# Patient Record
Sex: Male | Born: 1960 | Race: White | Hispanic: No | Marital: Married | Smoking: Former smoker
Health system: Southern US, Community
[De-identification: ages and names within clinical notes are randomized; demographics above are authoritative.]

## PROBLEM LIST (undated history)

## (undated) DIAGNOSIS — K219 Gastro-esophageal reflux disease without esophagitis: Secondary | ICD-10-CM

## (undated) DIAGNOSIS — C61 Malignant neoplasm of prostate: Secondary | ICD-10-CM

## (undated) DIAGNOSIS — J449 Chronic obstructive pulmonary disease, unspecified: Secondary | ICD-10-CM

## (undated) DIAGNOSIS — N401 Enlarged prostate with lower urinary tract symptoms: Secondary | ICD-10-CM

## (undated) DIAGNOSIS — Z972 Presence of dental prosthetic device (complete) (partial): Secondary | ICD-10-CM

## (undated) DIAGNOSIS — Z973 Presence of spectacles and contact lenses: Secondary | ICD-10-CM

## (undated) DIAGNOSIS — R3912 Poor urinary stream: Secondary | ICD-10-CM

## (undated) HISTORY — PX: UMBILICAL HERNIA REPAIR: SHX196

## (undated) HISTORY — PX: PROSTATE BIOPSY: SHX241

## (undated) HISTORY — PX: INGUINAL HERNIA REPAIR: SUR1180

## (undated) HISTORY — PX: TONSILLECTOMY: SUR1361

---

## 2000-12-29 ENCOUNTER — Ambulatory Visit (HOSPITAL_BASED_OUTPATIENT_CLINIC_OR_DEPARTMENT_OTHER): Admission: RE | Admit: 2000-12-29 | Discharge: 2000-12-29 | Payer: Self-pay | Admitting: *Deleted

## 2000-12-29 ENCOUNTER — Encounter (INDEPENDENT_AMBULATORY_CARE_PROVIDER_SITE_OTHER): Payer: Self-pay | Admitting: Specialist

## 2006-12-19 ENCOUNTER — Ambulatory Visit: Admission: RE | Admit: 2006-12-19 | Discharge: 2006-12-19 | Payer: Self-pay | Admitting: Family Medicine

## 2007-01-05 ENCOUNTER — Ambulatory Visit (HOSPITAL_BASED_OUTPATIENT_CLINIC_OR_DEPARTMENT_OTHER): Admission: RE | Admit: 2007-01-05 | Discharge: 2007-01-05 | Payer: Self-pay | Admitting: Surgery

## 2010-07-24 NOTE — Op Note (Signed)
[name], [name]             ACCOUNT NO.:  [id number]   MEDICAL RECORD NO.:  [id number]          PATIENT TYPE:  AMB   LOCATION:  [facility]                         FACILITY:  [facility]   PHYSICIAN:  Wilmon Arms. Corliss Skains, M.D. DATE OF BIRTH:  [birth year]   DATE OF PROCEDURE:  01/05/2007  DATE OF DISCHARGE:                               OPERATIVE REPORT   PREOPERATIVE DIAGNOSIS:  Umbilical hernia.   POSTOPERATIVE DIAGNOSIS:  Umbilical hernia.   PROCEDURE PERFORMED:  Umbilical hernia repair with mesh.   SURGEON:  Wilmon Arms. Corliss Skains, M.D., FACS   ANESTHESIA:  General.   INDICATIONS:  The patient is a 50 year old male who presents with a 2-  month history of a bulge in his umbilicus.  He has had some discomfort  but no obstructive symptoms.  He was noted to have an umbilical hernia.  We recommended surgical repair.   DESCRIPTION OF PROCEDURE:  The patient brought to the operating room and  placed in supine position on the operating table.  After an adequate  level of general anesthesia was obtained, the patient's periumbilical  region was shaved, prepped with Betadine and draped in sterile fashion.  Time-out was taken assure proper patient, proper procedure.  The area  below his umbilicus was infiltrated with 0.25% Marcaine.  I  made a  transverse incision below his umbilicus.  Dissection was carried down  into the subcutaneous tissues with cautery.  We bluntly dissected down  to the fascia.  I then worked superiorly and peeled the umbilical hernia  sac off of the posterior surface of the umbilical dermis.  The hernia  sac was opened and contained only some omental fat.  This was all  reduced back in the preperitoneal space.  The defect was only about 3 mm  across.  I opened it up to about a centimeter and opened up the  preperitoneal space bluntly.  A small Ventralex mesh was then inserted  in the preperitoneal space.  This secured with four interrupted 0  Novofil sutures.  0 Novofil was  used to close the fascia anterior to the  mesh.  3-0 Vicryl was used to close the dermis and 4-0 Monocryl was used  to close the skin.  Steri-Strips and clean dressings were applied.  The  patient was extubated and brought to recovery in stable condition.  All  sponge, instrument, needle counts correct.      Wilmon Arms. Tsuei, M.D.  Electronically Signed     MKT/MEDQ  D:  01/05/2007  T:  01/06/2007  Job:  [id number]

## 2010-07-27 NOTE — Op Note (Signed)
Wyatt [name], JAY             ACCOUNT NO.:  [id number]   MEDICAL RECORD NO.:  [id number]          PATIENT TYPE:  AMB   LOCATION:  [facility]                         FACILITY:  [facility]   PHYSICIAN:  Wilmon Arms. Corliss Skains, M.D. DATE OF BIRTH:  18-Apr-1960   DATE OF PROCEDURE:  DATE OF DISCHARGE:                               OPERATIVE REPORT      Wilmon Arms. Tsuei, M.D.  Electronically Signed     MKT/MEDQ  D:  01/06/2007  T:  01/06/2007  Job:

## 2010-07-27 NOTE — Op Note (Signed)
[place]  Patient:    Wyatt [name], Wyatt [name] Visit Number: [id number] MRN: [id number]          Service Type: DSU Location: [facility] Attending Physician:  Kandis Mannan Dictated by:   Donnie Coffin Samuella Cota, M.D. Proc. Date: 12/29/00 Admit Date:  12/29/2000   CC:         Dr. Renae Fickle   Operative Report  CCS# [phone]  PREOPERATIVE DIAGNOSIS:  Right inguinal hernia.  POSTOPERATIVE DIAGNOSIS:  Right inguinal hernia.  OPERATION PERFORMED:  Right inguinal herniorrhaphy with mesh.  SURGEON:  Maisie Fus B. Samuella Cota, M.D.  ANESTHESIA:  Local 1% Xylocaine without epinephrine, 0.25% Marcaine without epinephrine and sodium bicarbonate with anesthesia monitoring, anesthesiologist and CRNA  DESCRIPTION OF PROCEDURE:  The patient was taken to the operating room and placed on the table in supine position.  The right lower quadrant of the abdomen was prepped and draped as a sterile field.  An oblique skin incision was outlined with the skin marker.  The local anesthetic was used to block the ilioinguinal nerve the area for the incision, then used throughout as necessary.  The oblique incision was made through skin and subcutaneous tissues with subcutaneous bleeders being cauterized with the Bovie. The external oblique aponeurosis was divided in the line of its fibers to and through the external ring.  The cord structures and the ilioinguinal nerve were isolated with Penrose drain.  Dissection at the anterior medial aspect of the cord structures near the internal ring revealed that the patient had an indirect hernia sac which extended down about 5 to 6 cm.  This had a rather wide neck.  The hernia sac was opened.  There was no content in the hernia sac.  The hernia sac was twisted and a high suture ligation carried out with a suture ligature of 0 chromic catgut.  The stump was allowed to retract beneath the transversus muscle.  The patient had some minimal weakness of  the inguinal floor but did not really have a direct hernia.  A piece of 3 inch x 6 inch Atrium mesh was then fashioned to cover the entire inguinal floor to extend around the cord structures at the internal ring.  The mesh was anchored inferiorly with a running suture of 2-0 Novofil with the first suture being placed in the pubic tubercle, the other sutures in the shelving edge of Pouparts ligament.  The mesh was anchored superiorly and medially with interrupted sutures of 0 Novofil. A slit was made in the mesh to accommodate the cord structures of the internal ring and the mesh was sutured to itself superior and lateral to the internal ring with a 3-0 Vicryl suture.  The mesh seemed to be lying nicely in the inguinal floor and there was sufficient space for the cord structures to come through the internal ring.  The cord structures and ilioinguinal nerve were then returned to their normal anatomic position.  The inguinal nerve was then injected with the local to get a good block.  The external oblique aponeurosis was reapproximated with interrupted sutures of 3-0 Vicryl recreating the external ring so the tip of the little finger could be inserted through the external ring.  The subcutaneous tissues was irrigated, Scarpas fascia was closed with 3-0 Vicryl suture and the skin was closed with running subcuticular suture of 4-0 Vicryl.  Benzoin and half-inch Steri-Strips were used to reinforce the skin closure.  Dry sterile dressing was applied.  The patient seemed to tolerate the  procedure well and was taken to the PACU in satisfactory condition. Dictated by:   Donnie Coffin Samuella Cota, M.D. Attending Physician:  Kandis Mannan DD:  12/29/00 TD:  12/29/00 Job: [id number]

## 2010-12-19 LAB — BASIC METABOLIC PANEL
CO2: 31
Calcium: 9.1
Creatinine, Ser: 0.94
GFR calc Af Amer: >60
GFR calc non Af Amer: >60
Sodium: 142

## 2010-12-19 LAB — DIFFERENTIAL
Basophils Relative: 0
Lymphocytes Relative: 28
Monocytes Absolute: 0.5
Monocytes Relative: 7
Neutro Abs: 5
Neutrophils Relative %: 64

## 2010-12-19 LAB — CBC
Hemoglobin: 17.5 — ABNORMAL HIGH
MCHC: 35.3
RBC: 5.55
WBC: 7.8

## 2011-07-22 ENCOUNTER — Ambulatory Visit
Admission: RE | Admit: 2011-07-22 | Discharge: 2011-07-22 | Disposition: A | Discharge status: Home / Self Care | Payer: Managed Care, Other (non HMO) | Source: Ambulatory Visit | Attending: Gastroenterology | Admitting: Gastroenterology

## 2011-07-22 ENCOUNTER — Other Ambulatory Visit: Payer: Self-pay | Admitting: Gastroenterology

## 2017-07-31 ENCOUNTER — Ambulatory Visit
Admission: RE | Admit: 2017-07-31 | Discharge: 2017-07-31 | Disposition: A | Discharge status: Home / Self Care | Payer: Managed Care, Other (non HMO) | Source: Ambulatory Visit | Attending: Family Medicine | Admitting: Family Medicine

## 2017-07-31 ENCOUNTER — Other Ambulatory Visit: Payer: Self-pay | Admitting: Family Medicine

## 2017-07-31 DIAGNOSIS — R0602 Shortness of breath: Secondary | ICD-10-CM

## 2017-08-18 ENCOUNTER — Other Ambulatory Visit: Payer: Self-pay | Admitting: Urology

## 2017-08-18 DIAGNOSIS — C61 Malignant neoplasm of prostate: Secondary | ICD-10-CM

## 2017-08-26 ENCOUNTER — Ambulatory Visit (HOSPITAL_COMMUNITY)
Admission: RE | Admit: 2017-08-26 | Discharge: 2017-08-26 | Disposition: A | Discharge status: Home / Self Care | Payer: Managed Care, Other (non HMO) | Source: Ambulatory Visit | Attending: Urology | Admitting: Urology

## 2017-08-26 ENCOUNTER — Encounter (HOSPITAL_COMMUNITY)
Admission: RE | Admit: 2017-08-26 | Discharge: 2017-08-26 | Disposition: A | Discharge status: Home / Self Care | Payer: Managed Care, Other (non HMO) | Source: Ambulatory Visit | Attending: Urology | Admitting: Urology

## 2017-08-26 DIAGNOSIS — C61 Malignant neoplasm of prostate: Secondary | ICD-10-CM | POA: Insufficient documentation

## 2017-08-26 DIAGNOSIS — R93421 Abnormal radiologic findings on diagnostic imaging of right kidney: Secondary | ICD-10-CM | POA: Insufficient documentation

## 2017-08-26 DIAGNOSIS — R937 Abnormal findings on diagnostic imaging of other parts of musculoskeletal system: Secondary | ICD-10-CM | POA: Insufficient documentation

## 2017-08-26 MED ORDER — TECHNETIUM TC 99M MEDRONATE IV KIT
20.0000 | PACK | Freq: Once | INTRAVENOUS | Status: AC | PRN
  Administered 2017-08-26: 20.9 via INTRAVENOUS

## 2017-08-29 ENCOUNTER — Other Ambulatory Visit: Payer: Self-pay | Admitting: Urology

## 2017-08-29 ENCOUNTER — Ambulatory Visit (HOSPITAL_COMMUNITY)
Admission: RE | Admit: 2017-08-29 | Discharge: 2017-08-29 | Disposition: A | Discharge status: Home / Self Care | Payer: Managed Care, Other (non HMO) | Source: Ambulatory Visit | Attending: Urology | Admitting: Urology

## 2017-08-29 DIAGNOSIS — C61 Malignant neoplasm of prostate: Secondary | ICD-10-CM

## 2017-08-29 DIAGNOSIS — M7989 Other specified soft tissue disorders: Secondary | ICD-10-CM | POA: Insufficient documentation

## 2017-09-01 ENCOUNTER — Other Ambulatory Visit: Payer: Self-pay | Admitting: Urology

## 2017-09-01 DIAGNOSIS — C61 Malignant neoplasm of prostate: Secondary | ICD-10-CM

## 2017-09-02 ENCOUNTER — Encounter: Payer: Self-pay | Admitting: Radiation Oncology

## 2017-09-23 ENCOUNTER — Encounter: Payer: Self-pay | Admitting: Radiation Oncology

## 2017-09-23 NOTE — Progress Notes (Addendum)
GU Location of Tumor / Histology: prostatic adenocarcinoma  If Prostate Cancer, Gleason Score is (4 + 5) and PSA is (3.89). Prostate volume: 45 grams.   Wyatt [name] was referred by Dr. Damaris Hippo to Dr. Junious Silk to manage symptoms of an enlarged prostate. Patient explains his PCP is Dr. Mariea Clonts but Dr. Inda Castle has been seeing him lately because of [name]'s plans to retire. Dr. Inda Castle is at [facility]. Patient first told his PSA was elevated 1.5 years ago.   Biopsies of prostate (if applicable) revealed:    Past/Anticipated interventions by urology, if any: prostate biopsy, prescribed Tamsulosin (reports he continues to take it but it isn't as effective), CT abd/pelvis (-), Bone scan: possible left ankle lesion, cystoscopy/laser vaporization discussed but not done, referral to radiation oncology, follow up and bladder scan with [name] 7/29.  Past/Anticipated interventions by medical oncology, if any: no  Weight changes, if any: no  Bowel/Bladder complaints, if any: Reports frequency and weak stream. Reports LUTS greatly improved with Flomax. IPSS 20. Reports mild dysuria continues. Denies hematuria, urinary leakage or incontinence.   Nausea/Vomiting, if any: no  Pain issues, if any:  Intermittent pain in left ankle  SAFETY ISSUES:  Prior radiation? no  Pacemaker/ICD? no  Possible current pregnancy? no  Is the patient on methotrexate? no  Current Complaints / other details:  57 year old male. Married with 4 sons. Everyday smoker. Mother with hx of lung ca (smoker) and maternal grandfather with hx of esophageal (smoker) . Works as a Air traffic controller. Resides in [place].

## 2017-09-25 ENCOUNTER — Encounter: Payer: Self-pay | Admitting: Radiation Oncology

## 2017-09-25 ENCOUNTER — Encounter: Payer: Self-pay | Admitting: Medical Oncology

## 2017-09-25 ENCOUNTER — Ambulatory Visit
Admission: RE | Admit: 2017-09-25 | Discharge: 2017-09-25 | Disposition: A | Discharge status: Home / Self Care | Payer: Managed Care, Other (non HMO) | Source: Ambulatory Visit | Attending: Radiation Oncology | Admitting: Radiation Oncology

## 2017-09-25 ENCOUNTER — Other Ambulatory Visit: Payer: Self-pay

## 2017-09-25 ENCOUNTER — Encounter

## 2017-09-25 VITALS — BP 118/77 | HR 82 | Temp 98.1°F | Resp 20 | Ht 70.0 in | Wt 200.4 lb

## 2017-09-25 DIAGNOSIS — Z808 Family history of malignant neoplasm of other organs or systems: Secondary | ICD-10-CM | POA: Diagnosis not present

## 2017-09-25 DIAGNOSIS — Z87891 Personal history of nicotine dependence: Secondary | ICD-10-CM | POA: Insufficient documentation

## 2017-09-25 DIAGNOSIS — C61 Malignant neoplasm of prostate: Secondary | ICD-10-CM

## 2017-09-25 DIAGNOSIS — Z801 Family history of malignant neoplasm of trachea, bronchus and lung: Secondary | ICD-10-CM | POA: Insufficient documentation

## 2017-09-25 DIAGNOSIS — Z8052 Family history of malignant neoplasm of bladder: Secondary | ICD-10-CM | POA: Diagnosis not present

## 2017-09-25 DIAGNOSIS — Z886 Allergy status to analgesic agent status: Secondary | ICD-10-CM | POA: Diagnosis not present

## 2017-09-25 DIAGNOSIS — Z79899 Other long term (current) drug therapy: Secondary | ICD-10-CM | POA: Diagnosis not present

## 2017-09-25 HISTORY — DX: Malignant neoplasm of prostate: C61

## 2017-09-25 NOTE — Progress Notes (Signed)
Radiation Oncology         [phone] ________________________________  Initial outpatient Consultation  Name: Wyatt [name] MRN: [id number]  Date: 09/25/2017  DOB: [birth year]  WU:XLKGM, Herbie Baltimore, MD  Festus Aloe, MD  2 REFERRING PHYSICIAN: Festus Aloe, MD  DIAGNOSIS: 57 y.o. 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     ICD-10-CM   1. Malignant neoplasm of prostate [facility]) C61     HISTORY OF PRESENT ILLNESS: Wyatt [name] is a 57 y.o. male with a diagnosis of prostate cancer. He was noted to have increasing BOO sxs as well as a slightly elevated PSA of 3.89 as compared to PSA of 1.49 in 2012, by his primary care physician, Dr. Alyson Ingles.  Accordingly, he was referred for evaluation in urology by Dr. Festus Aloe, MD on 07/25/2017,  digital rectal examination was performed at that time revealing a 40mm midline, midgland nodule suspicious for prostate cancer .  The patient proceeded to transrectal ultrasound with 12 biopsies of the prostate on 08/13/17 .  The prostate volume measured 45 grams.  Out of 12 core biopsies,12 were positive.  The maximum Gleason score was 4+5=9, and this was seen in the left mid and left apex. Additionally, there was Gleason 4+4 in the left base, left apex lateral and right base lateral as well as Gleason 4+3 disease in the left base lateral, left apex lateral, right mid, right mid lateral, right apex and right apex lateral.  On 08/26/2017 he underwent a CT pelvis which showed a suspicious lesion in the left aspect of the prostate gland but no LAN. There were 3 sclerotic bony lesion noted of unknown significance.  On bone scan 08/26/17, there was increased focal activity overlying the lateral left ankle felt most likely to be posttraumatic or degenerative and otherwise, no evidence of bony metastatic disease.  The patient reviewed the biopsy results with his urologist and he has kindly been referred  today for discussion of potential radiation treatment options. After discussing his options with Dr. Junious Silk, he understands that, given his high volume, high risk disease on bx, prostatectomy alone is not likely to cure his disease and instead, he would have a high likelihood of still needing XRT and/or further treatment following surgery.  His mother has hx of lung cancer.   Wyatt [name] is accompanied by his wife and reports that he is doing well overall. He endorses fatigue, urgency, frequency, weak stream and mild dysuria. His biggest complaint is urgency. He takes Flomax as prescribed, with mild relief. The patient also experiences cramping in his feet. He has a follow up with Dr. Junious Silk on 10/06/2017 for cystoscopy to assess whether he might benefit from a laser TURP prior to proceeding with XRT. He has no desire for sexual activity for the last 1.5 years. He smokes every day. The patient denies hematuria, urinary leakage, urinary incontinence, lower extremity edema, nausea, vomiting, diarrhea and constipation.    PREVIOUS RADIATION THERAPY: No  PAST MEDICAL HISTORY:  Past Medical History:  Diagnosis Date  . Prostate cancer ([place])       PAST SURGICAL HISTORY: Past Surgical History:  Procedure Laterality Date  . PROSTATE BIOPSY      FAMILY HISTORY:  Family History  Problem Relation Age of Onset  . Lung cancer Mother        smoker  . Alzheimer's disease Mother   . Kidney Stones Brother   . Esophageal cancer Maternal Grandfather  smoker  . Bladder Cancer Cousin     SOCIAL HISTORY:  Social History   Socioeconomic History  . Marital status: Married    Spouse name: Not on file  . Number of children: 4  . Years of education: Not on file  . Highest education level: Not on file  Occupational History  . Occupation: Air traffic controller  Social Needs  . Financial resource strain: Not on file  . Food insecurity:    Worry: Not on file    Inability: Not on file  .  Transportation needs:    Medical: Not on file    Non-medical: Not on file  Tobacco Use  . Smoking status: Former Smoker    Packs/day: 1.00    Years: 30.00    Pack years: 30.00    Types: Cigarettes    Last attempt to quit: 07/10/1987    Years since quitting: 30.2  . Smokeless tobacco: Never Used  Substance and Sexual Activity  . Alcohol use: Never    Frequency: Never  . Drug use: Not on file  . Sexual activity: Not on file  Lifestyle  . Physical activity:    Days per week: Not on file    Minutes per session: Not on file  . Stress: Not on file  Relationships  . Social connections:    Talks on phone: Not on file    Gets together: Not on file    Attends religious service: Not on file    Active member of club or organization: Not on file    Attends meetings of clubs or organizations: Not on file    Relationship status: Not on file  . Intimate partner violence:    Fear of current or ex partner: Not on file    Emotionally abused: Not on file    Physically abused: Not on file    Forced sexual activity: Not on file  Other Topics Concern  . Not on file  Social History Narrative   Married with four sons.     ALLERGIES: Aspirin  MEDICATIONS:  Current Outpatient Medications  Medication Sig Dispense Refill  . ANORO ELLIPTA 62.5-25 MCG/INH AEPB INHALE 1 PUFF BY MOUTH ONCE DAILY  2  . Multiple Vitamins-Minerals (CENTRUM SILVER PO) Take by mouth.    . tamsulosin (FLOMAX) 0.4 MG CAPS capsule      No current facility-administered medications for this encounter.     REVIEW OF SYSTEMS:  On review of systems, the patient reports that he is doing well overall. He denies any chest pain, shortness of breath, cough, fevers, chills, night sweats, unintended weight changes. He denies any bowel disturbances, and denies abdominal pain, nausea or vomiting. He denies any new musculoskeletal or joint aches or pains. His IPSS was 20, indicating severe urinary symptoms. He is almost never able to  complete sexual activity. A complete review of systems is obtained and is otherwise negative.    PHYSICAL EXAM:  Wt Readings from Last 3 Encounters:  09/25/17 200 lb 6.4 oz (90.9 kg)   Temp Readings from Last 3 Encounters:  09/25/17 98.1 F (36.7 C) (Oral)   BP Readings from Last 3 Encounters:  09/25/17 118/77   Pulse Readings from Last 3 Encounters:  09/25/17 82   Pain Assessment Pain Score: 0-No pain/10  In general this is a well appearing Caucasian male in no acute distress. He is alert and oriented x4 and appropriate throughout the examination. HEENT reveals that the patient is normocephalic, atraumatic. EOMs are intact. PERRLA.  Skin is intact without any evidence of gross lesions. Cardiovascular exam reveals a regular rate and rhythm, no clicks rubs or murmurs are auscultated. Chest is clear to auscultation bilaterally. Lymphatic assessment is performed and does not reveal any adenopathy in the cervical, supraclavicular, axillary, or inguinal chains. Abdomen has active bowel sounds in all quadrants and is intact. The abdomen is soft, non tender, non distended. Lower extremities are negative for pretibial pitting edema, deep calf tenderness, cyanosis or clubbing.   KPS = 100  100 - Normal; no complaints; no evidence of disease. 90   - Able to carry on normal activity; minor signs or symptoms of disease. 80   - Normal activity with effort; some signs or symptoms of disease. 70   - Cares for self; unable to carry on normal activity or to do active work. 60   - Requires occasional assistance, but is able to care for most of his personal needs. 50   - Requires considerable assistance and frequent medical care. 76   - Disabled; requires special care and assistance. 20   - Severely disabled; hospital admission is indicated although death not imminent. 45   - Very sick; hospital admission necessary; active supportive treatment necessary. 10   - Moribund; fatal processes progressing  rapidly. 0     - Dead  Karnofsky DA, Abelmann Greene, Craver LS and [facility] [facility] [phone]) The use of the nitrogen mustards in the palliative treatment of carcinoma: with particular reference to bronchogenic carcinoma Cancer 1 634-56  LABORATORY DATA:  Lab Results  Component Value Date   WBC 7.8 01/05/2007   HGB 17.5 (H) 01/05/2007   HCT 49.6 01/05/2007   MCV 89.4 01/05/2007   PLT 202 01/05/2007   Lab Results  Component Value Date   NA 142 01/05/2007   K 3.7 01/05/2007   CL 106 01/05/2007   CO2 31 01/05/2007   No results found for: ALT, AST, GGT, ALKPHOS, BILITOT   RADIOGRAPHY: Dg Ankle 2 Views Left  Result Date: 08/29/2017 CLINICAL DATA:  Left medial ankle pain x many years; prostate ca patient with recent bone scan EXAM: LEFT ANKLE - 2 VIEW COMPARISON:  Nuclear medicine bone scan on 08/26/2016 FINDINGS: A well corticated bone density is identified adjacent to the MEDIAL malleolus, consistent with old injury or accessory ossicle. There is no acute abnormality of the LATERAL malleolus. No suspicious lesions. Mild skin thickening is noted adjacent to the LATERAL malleolus. Small plantar calcaneal spur is present. IMPRESSION: 1. No significant degenerative change, fracture, or lesion of the LATERAL malleolus. 2. Mild LATERAL soft tissue swelling. Electronically Signed   By: Nolon Nations M.D.   On: 08/29/2017 09:33      IMPRESSION/PLAN: 1. 57 y.o. 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  We discussed the patient's workup and outlined the nature of prostate cancer in this setting. The patient's T stage, Gleason's score, and PSA put him into the high risk group. Accordingly, he is eligible for a variety of potential treatment options including prostatectomy or LT-ADT in combination with 8 weeks of external radiation. Given the fact that he has high volume, high risk disease, the recommendation is to proceed with LT- ADT in combination  with 8 weeks of prostate IMRT.  We discussed the available radiation techniques, and focused on the details and logistics of delivery. The patient is not an ideal candidate for brachytherapy boost due to his persistent significant BOO sxs despite  taking Flomax.  He is scheduled for office cystoscopy with Dr. Junious Silk on 10/06/17 for consideration of laser TURP prior to beginning XRT.  We discussed and outlined the risks, benefits, short and long-term effects associated with radiotherapy and compared and contrasted these with prostatectomy. We discussed the role of SpaceOAR in reducing the rectal toxicity associated with radiotherapy. We also detailed the role of ADT in the treatment of prostate cancer and outlined the associated side effects that could be expected with this therapy.  He was encouraged to ask questions that were answered to his stated satisfaction.  At the conclusion of our conversation, the patient elects to proceed with LT-ADT in combination with an 8 week course of prostate IMRT.  We would anticipate beginning XRT approximately 8 weeks after starting ADT.  We will share our findings with Dr. Junious Silk and coordinate a follow up visit to initiate ADT in the near future. In the interim, he is scheduled for office cystoscopy with Dr. Junious Silk on 10/06/17 for further evaluation of BOO sxs and consideration for laser TURP prior to starting XRT. We will remain in close communication with Dr. Junious Silk and plan his treatment accordingly.  He knows to call with any questions or concerns in the interim.    Nicholos Johns, PA-C    Tyler Pita, MD  [facility] Direct Dial: [phone]  Fax: [phone] [email or web address]  Skype  [email or web address]    Page Me     This document serves as a record of services personally performed by Tyler Pita, MD and Nicholos Johns, PA-C. It was created on their behalf by Vanessa Ralphs, a trained medical scribe. The creation of this  record is based on the scribe's personal observations and the provider's statements to them. This document has been checked and approved by the attending provider.

## 2017-09-25 NOTE — Progress Notes (Signed)
See progress note under physician encounter. 

## 2017-09-25 NOTE — Progress Notes (Signed)
Introduced myself to Wyatt [name] and his wife as the prostate nurse navigator and my role. He is quite anxious about his diagnosis and ready to get treatment going. He was informed about 1 1/2 years ago he had an elevated PSA. Dr. Junious Silk discussed ADT but  has not had an injection. He also may need a cysto to evaluate his urinary function. He was prescribed Flomax, which helped for a few weeks but back to pre-Flomax symptoms. I gave them my card and asked them to call me with questions or concerns. I will continue to follow.

## 2017-09-27 DIAGNOSIS — C61 Malignant neoplasm of prostate: Secondary | ICD-10-CM | POA: Insufficient documentation

## 2017-10-08 ENCOUNTER — Other Ambulatory Visit: Payer: Self-pay | Admitting: Urology

## 2017-10-08 ENCOUNTER — Other Ambulatory Visit: Payer: Self-pay

## 2017-10-08 ENCOUNTER — Encounter (HOSPITAL_BASED_OUTPATIENT_CLINIC_OR_DEPARTMENT_OTHER): Payer: Self-pay | Admitting: *Deleted

## 2017-10-08 NOTE — Progress Notes (Signed)
Spoke w/ pt via phone for pre-op interview.  Npo after mn.  Arrive at 0530.  Will take flomax am dos w/ sips of water.

## 2017-10-08 NOTE — H&P (Signed)
Office Visit Report     10/06/2017   --------------------------------------------------------------------------------   Wyatt [name]  MRN: [id number]  PRIMARY CARE:  Audree Camel. Alyson Ingles, MD  DOB: 1960/10/17, 57 year old Male  REFERRING:  Damaris Hippo, MD  SSN:   PROVIDER:  Festus Aloe, M.D.    LOCATION:  [facility] [phone]   --------------------------------------------------------------------------------   CC/HPI: I have prostate cancer (treatment).     High Risk PCa Jun 2019  PSA 3.89  Prostate 45 grams  T2b (firm left prostate)  Gleason 4+3=7, left base, 95%  Gleason 4+4=8, left base, 90%  Gleason 4+3=7, let mid, 95%  Gleason 4+5=9, left mid, 90%  Gleason 4+4=8, left apex, 95%  Gleason 4+5=9, left apex, 95%  Gleason 3+4=7, right base, 20%  Gleason 4+4=8, right base, 5%  Gleason 4+3=7, right mid, 70%  Gleason 4+3=7, right mid, 80%  Gleason 4+3=7, right apex, 60%  Gleason 4+3=7, right apex, 60%  MSK: SV, LN at 30%; PFS surgery is 28% at 5 years  Staging June 2019 - CT pelvis without mets - left side prostate enhances. Bone scan with possible left ankle lesion. Left ankle xray benign.   AUASS = 32. No gross hematuria. No Gu surgery. SHIM = 24. PVR = 108 ml. No NG risk. No constipation. He drinks minimal water and coffee, Monster and a [place]. He started tamsulosin. It helped some. He has a slow stream.   He has mesh at the umbilicus and right inguinal area.   He was seen at [facility] and will proceed with LT-ADT and IMRT.    CC: I have symptoms of an enlarged prostate.  HPI: Wyatt [name] is a 57 year-old male established patient who is here for symptoms of enlarged prostate.  He first noticed the symptoms approximately 11/09/2016. His symptoms have not gotten worse over the last year.   Patient noticed a weak stream over the past year. His PSA went from 1.49 in 2012 to 3.87 in Apr 2019. He has dysuria. AUASS = 32. No gross hematuria. No Gu surgery.  SHIM = 24. PVR = 108 ml. UA clear. No NG risk. No constipation. He drinks minimal water and coffee, Monster and a [place].   Prostate was 45 grams. He returns for cystoscopy prior to beginning XRT.     ALLERGIES: Aspirin - Other Reaction, stomach issues    MEDICATIONS: Tamsulosin Hcl 0.4 mg capsule 1 capsule PO Daily  Bupropion Hcl 75 mg tablet  Calcium Carbonate  Goody's Extra Strength     GU PSH: Locm 300-399Mg /Ml Iodine,1Ml - 08/26/2017 Prostate Needle Biopsy - 08/13/2017      PSH Notes: Mesh placement (inner right leg)   Mesh (belly bottom)   NON-GU PSH: Surgical Pathology, Gross And Microscopic Examination For Prostate Needle - 08/13/2017    GU PMH: Prostate Cancer - 09/01/2017 Prostate nodule w/o LUTS - 08/13/2017 BPH w/LUTS - 07/25/2017 Urinary Frequency - 07/25/2017 Weak Urinary Stream - 07/25/2017    NON-GU PMH: Anxiety Asthma Depression GERD Hypercholesterolemia Other irritable bowel syndrome    FAMILY HISTORY: Death of family member - Father, Mother Dementia Of Alzheimer's Type - Mother Kidney Stones - Brother Lung Cancer - Mother    Notes: 4 sons   SOCIAL HISTORY: Marital Status: Married Preferred Language: English; Ethnicity: Not Hispanic Or Latino; Race: White Current Smoking Status: Patient smokes occasionally. Has smoked since 07/10/1987. Smokes 1 pack per day.   Tobacco Use Assessment Completed: Used Tobacco in last 30 days? Does  not use smokeless tobacco. Has never drank.  Drinks 4+ caffeinated drinks per day. Patient's occupation Radiographer, therapeutic.    REVIEW OF SYSTEMS:    GU Review Male:   Patient denies frequent urination, hard to postpone urination, burning/ pain with urination, get up at night to urinate, leakage of urine, stream starts and stops, trouble starting your stream, have to strain to urinate , erection problems, and penile pain.  Gastrointestinal (Upper):   Patient denies nausea, vomiting, and indigestion/ heartburn.   Gastrointestinal (Lower):   Patient denies diarrhea and constipation.  Constitutional:   Patient denies fever, night sweats, weight loss, and fatigue.  Skin:   Patient denies skin rash/ lesion and itching.  Eyes:   Patient denies blurred vision and double vision.  Ears/ Nose/ Throat:   Patient denies sore throat and sinus problems.  Hematologic/Lymphatic:   Patient denies swollen glands and easy bruising.  Cardiovascular:   Patient denies leg swelling and chest pains.  Respiratory:   Patient denies cough and shortness of breath.  Endocrine:   Patient denies excessive thirst.  Musculoskeletal:   Patient reports back pain. Patient denies joint pain.  Neurological:   Patient denies headaches and dizziness.  Psychologic:   Patient denies depression and anxiety.   VITAL SIGNS:      10/06/2017 03:07 PM  BP 145/73 mmHg  Pulse 78 /min  Temperature 98.0 F / 36.6 C   GU PHYSICAL EXAMINATION:    Urethral Meatus: Normal size. No lesion, no wart, no discharge, no polyp. Normal location.  Penis: Circumcised, no warts, no cracks. No dorsal Peyronie's plaques, no left corporal Peyronie's plaques, no right corporal Peyronie's plaques, no scarring, no warts. No balanitis, no meatal stenosis.   MULTI-SYSTEM PHYSICAL EXAMINATION:    Constitutional: Well-nourished. No physical deformities. Normally developed. Good grooming.  Neck: Neck symmetrical, not swollen. Normal tracheal position.  Respiratory: No labored breathing, no use of accessory muscles.   Cardiovascular: Normal temperature, normal extremity pulses, no swelling, no varicosities.  Skin: No paleness, no jaundice, no cyanosis. No lesion, no ulcer, no rash.  Neurologic / Psychiatric: Oriented to time, oriented to place, oriented to person. No depression, no anxiety, no agitation.  Gastrointestinal: No mass, no tenderness, no rigidity, non obese abdomen.     PAST DATA REVIEWED:  Source Of History:  Patient  Records Review:   Previous Doctor  Records   07/25/17  PSA  Total PSA 3.89 ng/mL   Notes:                     dr. Tammi Klippel notes    PROCEDURES:         Flexible Cystoscopy - 52000  Risks, benefits, and some of the potential complications of the procedure were discussed with the patient. All questions were answered. Informed consent was obtained. Antibiotic prophylaxis was given -- Cephalexin. Sterile technique and intraurethral analgesia were used.  Meatus:  Normal size. Normal location. Normal condition.  Urethra:  No strictures.  External Sphincter:  Normal.  Verumontanum:  Normal.  Prostate:  Obstructing. Enlarged median lobe. No hyperplasia.  Bladder Neck:  Non-obstructing.  Ureteral Orifices:  Normal location. Normal size. Normal shape. Effluxed clear urine.  Bladder:  No trabeculation. No tumors. Normal mucosa. No stones.      The lower urinary tract was carefully examined. The procedure was well-tolerated and without complications. Antibiotic instructions were given. Instructions were given to call the office immediately for bloody urine, difficulty urinating, painful urination, fever, chills, nausea,  vomiting or other illness. The patient stated that he understood these instructions and would comply with them.         Urinalysis Dipstick Dipstick Cont'd  Color: Yellow Bilirubin: Neg mg/dL  Appearance: Clear Ketones: Neg mg/dL  Specific Gravity: 1.020 Blood: Neg ery/uL  pH: 5.5 Protein: Neg mg/dL  Glucose: Neg mg/dL Urobilinogen: 0.2 mg/dL    Nitrites: Neg    Leukocyte Esterase: Neg leu/uL         Lupron Depot 45 mg / 6 Month - 96402, [id number] The hip was sterilely prepped with alcohol. Lupron was injected intramuscularly (IM) using standard technique. The patient tolerated the procedure well. A band aid was applied. The site was dry when the patient left the exam room. The patient will return as scheduled for his next Lupron injection.   Qty: 45 Adm. By: Anell Barr  Unit: mg Lot No [id number]  Route: IM Exp.  Date 02/01/2020  Freq: Q6M Mfgr.:   Site: Right Buttock   ASSESSMENT:      ICD-10 Details  1 GU:   BPH w/LUTS - N40.1   2   Prostate Cancer - C61    PLAN:           Schedule Return Visit/Planned Activity: Next Available Appointment - Schedule Surgery          Document Letter(s):  Created for Patient: Clinical Summary         Notes:   Prostate cancer-he was given Lupron 45 mg today.   BPH with lower urinary tract symptoms as obstructing median lobe on cystoscopy. He might do well with laser vaporization of the prostate. The lateral lobes did not appear obstructive. He had a high bladder neck. I discussed with the patient and his daughter the nature r/b/a to laser vaporization of prostate including side effects of the procedure, expected post-op course and likelihood of success. We discussed flow symptoms and irritative symptoms typically improve, but frequency and urgency can persist and rarely worsen. We also discussed risk of bleeding, infection, stricture, sexual dysfunction and incontinence among others. All questions answered. He elects to proceed.          Next Appointment:      Next Appointment: 10/14/2017 07:30 AM    Appointment Type: Surgery     Location: [facility] [phone]    Provider: Festus Aloe, M.D.    Reason for Visit: NE/OP THULIUM LASER VAPOR OF PROSTATE      * Signed by Festus Aloe, M.D. on 10/08/17 at 4:58 PM (EDT)*     The information contained in this medical record document is considered private and confidential patient information. This information can only be used for the medical diagnosis and/or medical services that are being provided by the patient's selected caregivers. This information can only be distributed outside of the patient's care if the patient agrees and signs waivers of authorization for this information to be sent to an outside source or route.

## 2017-10-13 NOTE — Anesthesia Preprocedure Evaluation (Addendum)
Anesthesia Evaluation  Patient identified by MRN, date of birth, ID band Patient awake    Reviewed: Allergy & Precautions, NPO status , Patient's Chart, lab work & pertinent test results  History of Anesthesia Complications Negative for: history of anesthetic complications  Airway Mallampati: I  TM Distance: >3 FB Neck ROM: Full    Dental  (+) Edentulous Upper, Dental Advisory Given Lower teeth intact:   Pulmonary COPD,  COPD inhaler, Current Smoker,    breath sounds clear to auscultation       Cardiovascular negative cardio ROS   Rhythm:Regular Rate:Normal     Neuro/Psych negative neurological ROS  negative psych ROS   GI/Hepatic Neg liver ROS, GERD  Medicated,  Endo/Other  negative endocrine ROS  Renal/GU negative Renal ROS   Prostate cancer    Musculoskeletal negative musculoskeletal ROS (+)   Abdominal   Peds  Hematology negative hematology ROS (+)   Anesthesia Other Findings Day of surgery medications reviewed with the patient.  Reproductive/Obstetrics                            Anesthesia Physical Anesthesia Plan  ASA: II  Anesthesia Plan: General   Post-op Pain Management:    Induction: Intravenous  PONV Risk Score and Plan: 1 and Ondansetron, Dexamethasone, Midazolam and Treatment may vary due to age or medical condition  Airway Management Planned: Oral ETT  Additional Equipment:   Intra-op Plan:   Post-operative Plan: Extubation in OR  Informed Consent: I have reviewed the patients History and Physical, chart, labs and discussed the procedure including the risks, benefits and alternatives for the proposed anesthesia with the patient or authorized representative who has indicated his/her understanding and acceptance.   Dental advisory given  Plan Discussed with: CRNA  Anesthesia Plan Comments:        Anesthesia Quick Evaluation

## 2017-10-14 ENCOUNTER — Encounter (HOSPITAL_BASED_OUTPATIENT_CLINIC_OR_DEPARTMENT_OTHER)
Admission: RE | Disposition: A | Discharge status: Home / Self Care | Payer: Self-pay | Source: Ambulatory Visit | Attending: Urology

## 2017-10-14 ENCOUNTER — Ambulatory Visit (HOSPITAL_BASED_OUTPATIENT_CLINIC_OR_DEPARTMENT_OTHER): Payer: Managed Care, Other (non HMO) | Admitting: Anesthesiology

## 2017-10-14 ENCOUNTER — Encounter: Payer: Self-pay | Admitting: Urology

## 2017-10-14 ENCOUNTER — Ambulatory Visit (HOSPITAL_BASED_OUTPATIENT_CLINIC_OR_DEPARTMENT_OTHER)
Admission: RE | Admit: 2017-10-14 | Discharge: 2017-10-14 | Disposition: A | Discharge status: Home / Self Care | Payer: Managed Care, Other (non HMO) | Source: Ambulatory Visit | Attending: Urology | Admitting: Urology

## 2017-10-14 ENCOUNTER — Encounter (HOSPITAL_BASED_OUTPATIENT_CLINIC_OR_DEPARTMENT_OTHER): Payer: Self-pay | Admitting: *Deleted

## 2017-10-14 DIAGNOSIS — Z801 Family history of malignant neoplasm of trachea, bronchus and lung: Secondary | ICD-10-CM | POA: Insufficient documentation

## 2017-10-14 DIAGNOSIS — C61 Malignant neoplasm of prostate: Secondary | ICD-10-CM | POA: Diagnosis not present

## 2017-10-14 DIAGNOSIS — F329 Major depressive disorder, single episode, unspecified: Secondary | ICD-10-CM | POA: Diagnosis not present

## 2017-10-14 DIAGNOSIS — Z820 Family history of epilepsy and other diseases of the nervous system: Secondary | ICD-10-CM | POA: Insufficient documentation

## 2017-10-14 DIAGNOSIS — K589 Irritable bowel syndrome without diarrhea: Secondary | ICD-10-CM | POA: Insufficient documentation

## 2017-10-14 DIAGNOSIS — Z886 Allergy status to analgesic agent status: Secondary | ICD-10-CM | POA: Insufficient documentation

## 2017-10-14 DIAGNOSIS — Z841 Family history of disorders of kidney and ureter: Secondary | ICD-10-CM | POA: Diagnosis not present

## 2017-10-14 DIAGNOSIS — N401 Enlarged prostate with lower urinary tract symptoms: Secondary | ICD-10-CM | POA: Diagnosis present

## 2017-10-14 DIAGNOSIS — J449 Chronic obstructive pulmonary disease, unspecified: Secondary | ICD-10-CM | POA: Insufficient documentation

## 2017-10-14 DIAGNOSIS — R3912 Poor urinary stream: Secondary | ICD-10-CM | POA: Diagnosis not present

## 2017-10-14 DIAGNOSIS — E7800 Pure hypercholesterolemia, unspecified: Secondary | ICD-10-CM | POA: Diagnosis not present

## 2017-10-14 DIAGNOSIS — Z79899 Other long term (current) drug therapy: Secondary | ICD-10-CM | POA: Diagnosis not present

## 2017-10-14 DIAGNOSIS — F17200 Nicotine dependence, unspecified, uncomplicated: Secondary | ICD-10-CM | POA: Insufficient documentation

## 2017-10-14 DIAGNOSIS — K219 Gastro-esophageal reflux disease without esophagitis: Secondary | ICD-10-CM | POA: Diagnosis not present

## 2017-10-14 DIAGNOSIS — J45909 Unspecified asthma, uncomplicated: Secondary | ICD-10-CM | POA: Diagnosis not present

## 2017-10-14 DIAGNOSIS — N138 Other obstructive and reflux uropathy: Secondary | ICD-10-CM

## 2017-10-14 HISTORY — DX: Poor urinary stream: R39.12

## 2017-10-14 HISTORY — DX: Gastro-esophageal reflux disease without esophagitis: K21.9

## 2017-10-14 HISTORY — DX: Presence of spectacles and contact lenses: Z97.3

## 2017-10-14 HISTORY — DX: Presence of dental prosthetic device (complete) (partial): Z97.2

## 2017-10-14 HISTORY — DX: Benign prostatic hyperplasia with lower urinary tract symptoms: N40.1

## 2017-10-14 HISTORY — PX: THULIUM LASER TURP (TRANSURETHRAL RESECTION OF PROSTATE): SHX6744

## 2017-10-14 HISTORY — DX: Chronic obstructive pulmonary disease, unspecified: J44.9

## 2017-10-14 SURGERY — THULIUM LASER TURP (TRANSURETHRAL RESECTION OF PROSTATE)
Anesthesia: General

## 2017-10-14 MED ORDER — DEXAMETHASONE SODIUM PHOSPHATE 10 MG/ML IJ SOLN
INTRAMUSCULAR | Status: AC
  Filled 2017-10-14: qty 1

## 2017-10-14 MED ORDER — MIDAZOLAM HCL 2 MG/2ML IJ SOLN
INTRAMUSCULAR | Status: AC
  Filled 2017-10-14: qty 2

## 2017-10-14 MED ORDER — LIDOCAINE 2% (20 MG/ML) 5 ML SYRINGE
INTRAMUSCULAR | Status: DC | PRN
  Administered 2017-10-14: 80 mg via INTRAVENOUS

## 2017-10-14 MED ORDER — DEXAMETHASONE SODIUM PHOSPHATE 10 MG/ML IJ SOLN
INTRAMUSCULAR | Status: DC | PRN
  Administered 2017-10-14: 10 mg via INTRAVENOUS

## 2017-10-14 MED ORDER — CEFAZOLIN SODIUM-DEXTROSE 2-4 GM/100ML-% IV SOLN
INTRAVENOUS | Status: AC
  Filled 2017-10-14: qty 100

## 2017-10-14 MED ORDER — PROPOFOL 10 MG/ML IV BOLUS
INTRAVENOUS | Status: AC
  Filled 2017-10-14: qty 40

## 2017-10-14 MED ORDER — PROPOFOL 10 MG/ML IV BOLUS
INTRAVENOUS | Status: DC | PRN
  Administered 2017-10-14: 20 mg via INTRAVENOUS
  Administered 2017-10-14: 180 mg via INTRAVENOUS

## 2017-10-14 MED ORDER — BELLADONNA ALKALOIDS-OPIUM 16.2-60 MG RE SUPP
RECTAL | Status: AC
  Filled 2017-10-14: qty 1

## 2017-10-14 MED ORDER — OXYBUTYNIN CHLORIDE ER 10 MG PO TB24
ORAL_TABLET | ORAL | Status: AC
  Filled 2017-10-14: qty 1

## 2017-10-14 MED ORDER — SODIUM CHLORIDE 0.9 % IR SOLN
Status: DC | PRN
  Administered 2017-10-14 (×5): 3000 mL via INTRAVESICAL

## 2017-10-14 MED ORDER — MIDAZOLAM HCL 2 MG/2ML IJ SOLN
INTRAMUSCULAR | Status: DC | PRN
  Administered 2017-10-14: 2 mg via INTRAVENOUS

## 2017-10-14 MED ORDER — FENTANYL CITRATE (PF) 100 MCG/2ML IJ SOLN
INTRAMUSCULAR | Status: AC
  Filled 2017-10-14: qty 2

## 2017-10-14 MED ORDER — LACTATED RINGERS IV SOLN
INTRAVENOUS | Status: DC
  Administered 2017-10-14 (×2): via INTRAVENOUS
  Filled 2017-10-14: qty 1000

## 2017-10-14 MED ORDER — ONDANSETRON HCL 4 MG/2ML IJ SOLN
INTRAMUSCULAR | Status: AC
  Filled 2017-10-14: qty 2

## 2017-10-14 MED ORDER — OXYCODONE-ACETAMINOPHEN 5-325 MG PO TABS: 1.0000 | ORAL_TABLET | Freq: Four times a day (QID) | ORAL | 0 refills | Status: AC | PRN

## 2017-10-14 MED ORDER — FENTANYL CITRATE (PF) 100 MCG/2ML IJ SOLN
INTRAMUSCULAR | Status: DC | PRN
  Administered 2017-10-14: 25 ug via INTRAVENOUS
  Administered 2017-10-14: 50 ug via INTRAVENOUS

## 2017-10-14 MED ORDER — FENTANYL CITRATE (PF) 100 MCG/2ML IJ SOLN
25.0000 ug | INTRAMUSCULAR | Status: DC | PRN
  Filled 2017-10-14: qty 1

## 2017-10-14 MED ORDER — OXYBUTYNIN CHLORIDE ER 10 MG PO TB24
10.0000 mg | ORAL_TABLET | Freq: Once | ORAL | Status: AC
  Administered 2017-10-14: 10 mg via ORAL
  Filled 2017-10-14: qty 1

## 2017-10-14 MED ORDER — CEFAZOLIN SODIUM-DEXTROSE 2-4 GM/100ML-% IV SOLN
2.0000 g | Freq: Once | INTRAVENOUS | Status: AC
  Administered 2017-10-14: 2 g via INTRAVENOUS
  Filled 2017-10-14: qty 100

## 2017-10-14 MED ORDER — BELLADONNA ALKALOIDS-OPIUM 16.2-60 MG RE SUPP
RECTAL | Status: DC | PRN
  Administered 2017-10-14: 1 via RECTAL

## 2017-10-14 MED ORDER — PHENYLEPHRINE 40 MCG/ML (10ML) SYRINGE FOR IV PUSH (FOR BLOOD PRESSURE SUPPORT)
PREFILLED_SYRINGE | INTRAVENOUS | Status: DC | PRN
  Administered 2017-10-14 (×3): 40 ug via INTRAVENOUS

## 2017-10-14 MED ORDER — CEPHALEXIN 500 MG PO CAPS: 500.0000 mg | ORAL_CAPSULE | Freq: Every day | ORAL | 0 refills | Status: DC

## 2017-10-14 MED ORDER — LIDOCAINE 2% (20 MG/ML) 5 ML SYRINGE
INTRAMUSCULAR | Status: AC
  Filled 2017-10-14: qty 5

## 2017-10-14 SURGICAL SUPPLY — 19 items
BAG DRAIN URO-CYSTO SKYTR STRL (DRAIN) ×3 IMPLANT
BAG DRN UROCATH (DRAIN) ×1
BAG URINE DRAINAGE (UROLOGICAL SUPPLIES) ×3 IMPLANT
CATH COUDE FOLEY 2W 5CC 18FR (CATHETERS) ×2 IMPLANT
CATH FOLEY 3WAY 30CC 22F (CATHETERS) IMPLANT
CLOTH BEACON ORANGE TIMEOUT ST (SAFETY) ×3 IMPLANT
GLOVE BIO SURGEON STRL SZ7.5 (GLOVE) ×3 IMPLANT
GOWN STRL REUS W/TWL XL LVL3 (GOWN DISPOSABLE) ×3 IMPLANT
HOLDER FOLEY CATH W/STRAP (MISCELLANEOUS) IMPLANT
IV NS IRRIG 3000ML ARTHROMATIC (IV SOLUTION) ×3 IMPLANT
KIT TURNOVER CYSTO (KITS) ×3 IMPLANT
LASER REVOLIX PROCEDURE (MISCELLANEOUS) ×3 IMPLANT
LOOP CUT BIPOLAR 24F LRG (ELECTROSURGICAL) IMPLANT
MANIFOLD NEPTUNE II (INSTRUMENTS) ×3 IMPLANT
PACK CYSTO (CUSTOM PROCEDURE TRAY) ×3 IMPLANT
SYR 30ML LL (SYRINGE) IMPLANT
TUBE CONNECTING 12'X1/4 (SUCTIONS)
TUBE CONNECTING 12X1/4 (SUCTIONS) IMPLANT
TUBING UROLOGY SET (TUBING) ×2 IMPLANT

## 2017-10-14 NOTE — Anesthesia Procedure Notes (Signed)
Procedure Name: LMA Insertion Date/Time: 10/14/2017 7:33 AM Performed by: Suan Halter, CRNA Pre-anesthesia Checklist: Patient identified, Emergency Drugs available, Suction available and Patient being monitored Patient Re-evaluated:Patient Re-evaluated prior to induction Oxygen Delivery Method: Circle system utilized Preoxygenation: Pre-oxygenation with 100% oxygen Induction Type: IV induction Ventilation: Mask ventilation without difficulty LMA: LMA inserted LMA Size: 4.0 Number of attempts: 1 Airway Equipment and Method: Bite block Placement Confirmation: positive ETCO2 Tube secured with: Tape Dental Injury: Teeth and Oropharynx as per pre-operative assessment

## 2017-10-14 NOTE — Op Note (Signed)
Preoperative diagnosis: BPH, weak stream   Postoperative diagnosis: BPH, weak stream  Procedure: Thulium laser vaporization of the prostate  Surgeon: Junious Silk  Anesthesia: General  Indication for procedure: 57 year old male with BPH and lower urinary tract symptoms.  He also has prostate cancer and was recently started on androgen deprivation therapy again radiation therapy in the next couple of months.  Prior to radiation he elected to undergo laser vaporization of the prostate to weak stream and obstructing prostate on cystoscopy.  Findings: On cystoscopy, obstructing median lobe and high bladder neck, short lateral lobes.  Bladder with mild trabeculation but otherwise normal.  No mucosal lesions.  No stones or foreign bodies.  Ureteral orifice ease were normal with clear efflux.  Description of procedure: After consent was obtained the patient brought to the operating room.  A timeout was performed to confirm the patient and procedure.  The laser continuous flow sheath scope were passed per urethra and the bladder inspected.  Laser fiber was passed in the ureteral orifice bladder neck the 5:00 and 7:00 on the left and right side and brought these incisions down to the verumontanum.  The median lobe was then vaporized.  I then made the typical hockey-stick incisions at the apices on the left and right and then from anterior to posterior bladder neck back to the veru.  The lateral lobes were vaporized.  This created an excellent channel.  At low pressure hemostasis was excellent and there was a slight amount of residual lateral lobe tissue on each side that was vaporized.  Vaporization was kept between the bladder neck and the veru distally.  Again, hemostasis was excellent under low pressure good channel.  The bladder was filled and the scope removed.  An 48 French coud catheter was placed 15 cc in the balloon in left to gravity drainage.  Urine was clear.  I placed a B&O suppository.  Patient was  awakened and taken to the recovery room in stable condition.  Complications: None  Blood loss: Minimal  Specimens: None  Drains: 26 French coud catheter  Disposition: Patient stable to PACU

## 2017-10-14 NOTE — Interval H&P Note (Signed)
History and Physical Interval Note:  10/14/2017 7:23 AM  Wyatt [name]  has presented today for surgery, with the diagnosis of BENIGN PROSTATIC HYPERPLASIA, LOWER URINARY TRACT SYMPTOMS  The various methods of treatment have been discussed with the patient and family. After consideration of risks, benefits and other options for treatment, including side effects of the proposed treatment, the likelihood of the patient achieving the goals of the procedure, and any potential problems that might occur during the procedure or recuperation. The patient has consented to  Procedure(s): THULIUM LASER TURP (TRANSURETHRAL RESECTION OF PROSTATE) (N/A) as a surgical intervention .  The patient's history has been reviewed, patient examined, no change in status, stable for surgery. He has a chronic cough due to smoking. No change. No fever or dysuria.   I have reviewed the patient's chart and labs.  Questions were answered to the patient's and daughter's satisfaction.     Festus Aloe

## 2017-10-14 NOTE — Progress Notes (Signed)
Patient started ADT on 10/06/17 and had laser TURP 10/14/17 with Dr. Junious Silk.  He will need fiducial markers and [employer] with Dr. Junious Silk prior to [place].  We would anticipate starting treatments around the first week of October since he started ADT 10/06/17. Inbox request sent to [facility] on 10/14/17 to contact [facility] to coordinate for fiducials and [place] with Dr. Junious Silk sometime in mid September and [facility] shortly thereafter. Ailene Ards

## 2017-10-14 NOTE — Discharge Instructions (Signed)
Post Anesthesia Home Care Instructions  Activity: Get plenty of rest for the remainder of the day. A responsible adult should stay with you for 24 hours following the procedure.  For the next 24 hours, DO NOT: -Drive a car -Paediatric nurse -Drink alcoholic beverages -Take any medication unless instructed by your physician -Make any legal decisions or sign important papers.  Meals: Start with liquid foods such as gelatin or soup. Progress to regular foods as tolerated. Avoid greasy, spicy, heavy foods. If nausea and/or vomiting occur, drink only clear liquids until the nausea and/or vomiting subsides. Call your physician if vomiting continues.  Special Instructions/Symptoms: Your throat may feel dry or sore from the anesthesia or the breathing tube placed in your throat during surgery. If this causes discomfort, gargle with warm salt water. The discomfort should disappear within 24 hours.  If you had a scopolamine patch placed behind your ear for the management of post- operative nausea and/or vomiting:  1. The medication in the patch is effective for 72 hours, after which it should be removed.  Wrap patch in a tissue and discard in the trash. Wash hands thoroughly with soap and water. 2. You may remove the patch earlier than 72 hours if you experience unpleasant side effects which may include dry mouth, dizziness or visual disturbances. 3. Avoid touching the patch. Wash your hands with soap and water after contact with the patch.        Indwelling Urinary Catheter Care, Adult Take good care of your catheter to keep it working and to prevent problems. How to wear your catheter Attach your catheter to your leg with tape (adhesive tape) or a leg strap. Make sure it is not too tight. If you use tape, remove any bits of tape that are already on the catheter. How to wear a drainage bag You should have:  A large overnight bag.  A small leg bag.  Overnight Bag You may wear the  overnight bag at any time. Always keep the bag below the level of your bladder but off the floor. When you sleep, put a clean plastic bag in a wastebasket. Then hang the bag inside the wastebasket. Leg Bag Never wear the leg bag at night. Always wear the leg bag below your knee. Keep the leg bag secure with a leg strap or tape. How to care for your skin  Clean the skin around the catheter at least once every day.  Shower every day. Do not take baths.  Put creams, lotions, or ointments on your genital area only as told by your doctor.  Do not use powders, sprays, or lotions on your genital area. How to clean your catheter and your skin 1. Wash your hands with soap and water. 2. Wet a washcloth in warm water and gentle (mild) soap. 3. Use the washcloth to clean the skin where the catheter enters your body. Clean downward and wipe away from the catheter in small circles. Do not wipe toward the catheter. 4. Pat the area dry with a clean towel. Make sure to clean off all soap. How to care for your drainage bags Empty your drainage bag when it is ?- full or at least 2-3 times a day. Replace your drainage bag once a month or sooner if it starts to smell bad or look dirty. Do not clean your drainage bag unless told by your doctor. Emptying a drainage bag  Supplies Needed  Rubbing alcohol.  Gauze pad or cotton ball.  Tape or  a leg strap.  Steps 1. Wash your hands with soap and water. 2. Separate (detach) the bag from your leg. 3. Hold the bag over the toilet or a clean container. Keep the bag below your hips and bladder. This stops pee (urine) from going back into the tube. 4. Open the pour spout at the bottom of the bag. 5. Empty the pee into the toilet or container. Do not let the pour spout touch any surface. 6. Put rubbing alcohol on a gauze pad or cotton ball. 7. Use the gauze pad or cotton ball to clean the pour spout. 8. Close the pour spout. 9. Attach the bag to your leg with  tape or a leg strap. 10. Wash your hands.  Changing a drainage bag Supplies Needed  Alcohol wipes.  A clean drainage bag.  Adhesive tape or a leg strap.  Steps 1. Wash your hands with soap and water. 2. Separate the dirty bag from your leg. 3. Pinch the rubber catheter with your fingers so that pee does not spill out. 4. Separate the catheter tube from the drainage tube where these tubes connect (at the connection valve). Do not let the tubes touch any surface. 5. Clean the end of the catheter tube with an alcohol wipe. Use a different alcohol wipe to clean the end of the drainage tube. 6. Connect the catheter tube to the drainage tube of the clean bag. 7. Attach the new bag to the leg with adhesive tape or a leg strap. 8. Wash your hands.  How to prevent infection and other problems  Never pull on your catheter or try to remove it. Pulling can damage tissue in your body.  Always wash your hands before and after touching your catheter.  If a leg strap gets wet, replace it with a dry one.  Drink enough fluids to keep your pee clear or pale yellow, or as told by your doctor.  Do not let the drainage bag or tubing touch the floor.  Wear cotton underwear.  If you are male, wipe from front to back after you poop (have a bowel movement).  Check on the catheter often to make sure it works and the tubing is not twisted. Get help if:  Your pee is cloudy.  Your pee smells unusually bad.  Your pee is not draining into the bag.  Your tube gets clogged.  Your catheter starts to leak.  Your bladder feels full. Get help right away if:  You have redness, swelling, or pain where the catheter enters your body.  You have fluid, pus, or a bad smell coming from the area where the catheter enters your body.  The area where the catheter enters your body feels warm.  You have a fever.  You have pain in your: ? Stomach (abdomen). ? Legs. ? Lower back. ? Bladder.  You see  blood fill the catheter.  Your pee is pink or red.  You feel sick to your stomach (nauseous).  You throw up (vomit).  You have chills.  Your catheter gets pulled out. This information is not intended to replace advice given to you by your health care provider. Make sure you discuss any questions you have with your health care provider. Document Released: 06/22/2012 Document Revised: 01/24/2016 Document Reviewed: 08/10/2013 Elsevier Interactive Patient Education  2018 [facility], Care After This sheet gives you information about how to care for yourself after your procedure. Your health care provider may also give you  more specific instructions. If you have problems or questions, contact your health care provider. What can I expect after the procedure? For the first few weeks after the procedure:  You will feel a need to urinate often.  You may have blood in your urine.  You may feel a sudden need to urinate.  Once your urinary catheter is removed, you may have a burning feeling when you urinate, especially at the end of urination. This feeling usually passes within 3-5 days. Follow these instructions at home: Activity  Return to your normal activities as told by your health care provider. Ask your health care provider what activities are safe for you.  Do not do vigorous exercise for 1 week or as told by your health care provider.  Do not lift anything that is heavier than 10 lb (4.5 kg) until your health care provider say it is safe.  Avoid sexual activity for 4-6 weeks or as told by your health care provider.  Do not ride in a car for extended periods of time for 1 month or as told by your health care provider.  Do not drive for 24 hours if you were given a medicine to help you relax (sedative). Diet  Eat foods that are high in fiber, such as fresh fruits and vegetables, whole grains, and beans.  Drink enough fluid to keep your urine clear or  pale yellow. Medicines  Take over-the-counter and prescription medicines, including stool softeners, only as told by your health care provider.  If you were prescribed an antibiotic medicine, take it as told by your health care provider. Do not stop taking the antibiotic even if you start to feel better. General instructions  If you were given elastic support stockings, wear them as told by your health care provider.  Do not strain to have a bowel movement. Straining may lead to bleeding from the prostate and cause clots to form and cause trouble urinating.  Keep all follow-up visits as told by your health care provider. This is important. Contact a health care provider if:  You have a fever or chills.  You have spasms or pain with the urinary catheter still in place.  Once the catheter has been removed, you experience difficulty starting your stream when attempting to urinate. Get help right away if:  There is a blockage in your catheter.  Your catheter has been removed and you are suddenly unable to urinate.  Your urine smells unusually bad.  You start to have blood clots in your urine.  The blood in your urine becomes persistent or gets thick.  You develop chest pains.  You develop shortness of breath.  You develop swelling or pain in your leg. Summary  You may notice urinary symptoms for a few weeks after your procedure.  Follow instructions from your health care provider regarding activity restrictions such as lifting, exercise, and sexual activity.  Contact your health care provider if you have any unusual symptoms during your recovery. This information is not intended to replace advice given to you by your health care provider. Make sure you discuss any questions you have with your health care provider. Document Released: 02/25/2005 Document Revised: 10/13/2015 Document Reviewed: 10/13/2015 Elsevier Interactive Patient Education  2018 [employer].

## 2017-10-14 NOTE — Transfer of Care (Signed)
Immediate Anesthesia Transfer of Care Note  Patient: Wyatt [name]  Procedure(s) Performed: Procedure(s) (LRB): THULIUM LASER TURP (TRANSURETHRAL RESECTION OF PROSTATE) (N/A)  Patient Location: PACU  Anesthesia Type: General  Level of Consciousness: awake, oriented, sedated and patient cooperative  Airway & Oxygen Therapy: Patient Spontanous Breathing and Patient connected to face mask oxygen  Post-op Assessment: Report given to PACU RN and Post -op Vital signs reviewed and stable  Post vital signs: Reviewed and stable  Complications: No apparent anesthesia complications  Last Vitals:  Vitals Value Taken Time  BP 105/67 10/14/2017  8:19 AM  Temp 36.7 C 10/14/2017  8:18 AM  Pulse 79 10/14/2017  8:29 AM  Resp 16 10/14/2017  8:29 AM  SpO2 89 % 10/14/2017  8:29 AM  Vitals shown include unvalidated device data.  Last Pain:  Vitals:   10/14/17 0818  TempSrc:   PainSc: 0-No pain

## 2017-10-14 NOTE — Anesthesia Postprocedure Evaluation (Signed)
Anesthesia Post Note  Patient: Wyatt [name]  Procedure(s) Performed: Marcelino Duster LASER TURP (TRANSURETHRAL RESECTION OF PROSTATE) (N/A )     Patient location during evaluation: PACU Anesthesia Type: General Level of consciousness: awake and alert Pain management: pain level controlled Vital Signs Assessment: post-procedure vital signs reviewed and stable Respiratory status: spontaneous breathing, nonlabored ventilation, respiratory function stable and patient connected to nasal cannula oxygen Cardiovascular status: blood pressure returned to baseline and stable Postop Assessment: no apparent nausea or vomiting Anesthetic complications: no    Last Vitals:  Vitals:   10/14/17 0931 10/14/17 1105  BP: 111/65 123/80  Pulse: 68 73  Resp: 14 16  Temp:  36.7 C  SpO2: 93% 93%    Last Pain:  Vitals:   10/14/17 1105  TempSrc: Oral  PainSc: 5                  Vonna Brabson L Erynne Kealey

## 2017-10-15 ENCOUNTER — Encounter (HOSPITAL_BASED_OUTPATIENT_CLINIC_OR_DEPARTMENT_OTHER): Payer: Self-pay | Admitting: Urology

## 2017-11-19 ENCOUNTER — Encounter: Payer: Self-pay | Admitting: Urology

## 2017-11-19 ENCOUNTER — Other Ambulatory Visit: Payer: Self-pay | Admitting: Urology

## 2017-11-19 DIAGNOSIS — C61 Malignant neoplasm of prostate: Secondary | ICD-10-CM

## 2017-11-19 NOTE — Progress Notes (Signed)
Patient had fiducial markers and SpaceOAR placed by Dr. Junious Silk in the office on 11/17/2017.  He is currently scheduled for CT simulation on 11/20/2017 and will need an MRI of the prostate coordinated for treatment planning.   Nicholos Johns, PA-C

## 2017-11-20 ENCOUNTER — Ambulatory Visit
Admission: RE | Admit: 2017-11-20 | Discharge: 2017-11-20 | Disposition: A | Discharge status: Home / Self Care | Payer: Managed Care, Other (non HMO) | Source: Ambulatory Visit | Attending: Radiation Oncology | Admitting: Radiation Oncology

## 2017-11-20 DIAGNOSIS — Z510 Encounter for antineoplastic radiation therapy: Secondary | ICD-10-CM | POA: Insufficient documentation

## 2017-11-20 DIAGNOSIS — C61 Malignant neoplasm of prostate: Secondary | ICD-10-CM | POA: Diagnosis not present

## 2017-11-20 NOTE — Progress Notes (Signed)
  Radiation Oncology         [phone]) [phone] ________________________________  Name: Wyatt [name] MRN: [id number]  Date: 11/20/2017  DOB: 02/16/1961  SIMULATION AND TREATMENT PLANNING NOTE  No diagnosis found.  DIAGNOSIS:  57 y.o. 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  Identity was confirmed.  All relevant records and images related to the planned course of therapy were reviewed.  The patient freely provided informed written consent to proceed with treatment after reviewing the details related to the planned course of therapy. The consent form was witnessed and verified by the simulation staff.  Then, the patient was set-up in a stable reproducible supine position for radiation therapy.  A vacuum lock pillow device was custom fabricated to position his legs in a reproducible immobilized position.  Then, I performed a urethrogram under sterile conditions to identify the prostatic apex.  CT images were obtained.  Surface markings were placed.  The CT images were loaded into the planning software.  Then the prostate target and avoidance structures including the rectum, bladder, bowel and hips were contoured.  Treatment planning then occurred.  The radiation prescription was entered and confirmed.  A total of one complex treatment devices were fabricated. I have requested : Intensity Modulated Radiotherapy (IMRT) is medically necessary for this case for the following reason:  Rectal sparing.Marland Kitchen  PLAN:  The patient will receive 45 Gy in 25 fractions of 1.8 Gy, followed by a boost to the prostate to a total dose of 75 Gy with 15 additional fractions of 2.0 Gy.  ________________________________  Sheral Apley Tammi Klippel, M.D.  This document serves as a record of services personally performed by Tyler Pita, MD. It was created on his behalf by Rae Lips, a trained  medical scribe. The creation of this record is based on the scribe's personal observations and the provider's statements to them. This document has been checked and approved by the attending provider.

## 2017-11-25 ENCOUNTER — Ambulatory Visit (HOSPITAL_COMMUNITY)
Admission: RE | Admit: 2017-11-25 | Discharge: 2017-11-25 | Disposition: A | Discharge status: Home / Self Care | Payer: Managed Care, Other (non HMO) | Source: Ambulatory Visit | Attending: Urology | Admitting: Urology

## 2017-11-25 DIAGNOSIS — C61 Malignant neoplasm of prostate: Secondary | ICD-10-CM | POA: Insufficient documentation

## 2017-11-28 DIAGNOSIS — C61 Malignant neoplasm of prostate: Secondary | ICD-10-CM | POA: Diagnosis not present

## 2017-12-01 ENCOUNTER — Encounter: Payer: Self-pay | Admitting: Medical Oncology

## 2017-12-01 ENCOUNTER — Ambulatory Visit
Admission: RE | Admit: 2017-12-01 | Discharge: 2017-12-01 | Disposition: A | Discharge status: Home / Self Care | Payer: Managed Care, Other (non HMO) | Source: Ambulatory Visit | Attending: Radiation Oncology | Admitting: Radiation Oncology

## 2017-12-01 DIAGNOSIS — C61 Malignant neoplasm of prostate: Secondary | ICD-10-CM | POA: Diagnosis not present

## 2017-12-02 ENCOUNTER — Ambulatory Visit
Admission: RE | Admit: 2017-12-02 | Discharge: 2017-12-02 | Disposition: A | Discharge status: Home / Self Care | Payer: Managed Care, Other (non HMO) | Source: Ambulatory Visit | Attending: Radiation Oncology | Admitting: Radiation Oncology

## 2017-12-02 DIAGNOSIS — C61 Malignant neoplasm of prostate: Secondary | ICD-10-CM | POA: Diagnosis not present

## 2017-12-03 ENCOUNTER — Ambulatory Visit
Admission: RE | Admit: 2017-12-03 | Discharge: 2017-12-03 | Disposition: A | Discharge status: Home / Self Care | Payer: Managed Care, Other (non HMO) | Source: Ambulatory Visit | Attending: Radiation Oncology | Admitting: Radiation Oncology

## 2017-12-03 DIAGNOSIS — C61 Malignant neoplasm of prostate: Secondary | ICD-10-CM | POA: Diagnosis not present

## 2017-12-04 ENCOUNTER — Ambulatory Visit
Admission: RE | Admit: 2017-12-04 | Discharge: 2017-12-04 | Disposition: A | Discharge status: Home / Self Care | Payer: Managed Care, Other (non HMO) | Source: Ambulatory Visit | Attending: Radiation Oncology | Admitting: Radiation Oncology

## 2017-12-04 DIAGNOSIS — C61 Malignant neoplasm of prostate: Secondary | ICD-10-CM | POA: Diagnosis not present

## 2017-12-05 ENCOUNTER — Ambulatory Visit
Admission: RE | Admit: 2017-12-05 | Discharge: 2017-12-05 | Disposition: A | Discharge status: Home / Self Care | Payer: Managed Care, Other (non HMO) | Source: Ambulatory Visit | Attending: Radiation Oncology | Admitting: Radiation Oncology

## 2017-12-05 DIAGNOSIS — C61 Malignant neoplasm of prostate: Secondary | ICD-10-CM | POA: Diagnosis not present

## 2017-12-05 NOTE — Progress Notes (Signed)
Pt here for patient teaching.  Pt given Radiation and You booklet.  Reviewed areas of pertinence such as diarrhea, fatigue, sexual and fertility changes and urinary and bladder changes . Pt able to give teach back of to pat skin, use baby wipes, have Imodium on hand, drink plenty of water and sitz bath,Pt demonstrated understanding of information given and will contact nursing with any questions or concerns.     Http://rtanswers.org/treatmentinformation/whattoexpect/index

## 2017-12-08 ENCOUNTER — Ambulatory Visit
Admission: RE | Admit: 2017-12-08 | Discharge: 2017-12-08 | Disposition: A | Discharge status: Home / Self Care | Payer: Managed Care, Other (non HMO) | Source: Ambulatory Visit | Attending: Radiation Oncology | Admitting: Radiation Oncology

## 2017-12-08 DIAGNOSIS — C61 Malignant neoplasm of prostate: Secondary | ICD-10-CM | POA: Diagnosis not present

## 2017-12-09 ENCOUNTER — Ambulatory Visit
Admission: RE | Admit: 2017-12-09 | Discharge: 2017-12-09 | Disposition: A | Discharge status: Home / Self Care | Payer: Managed Care, Other (non HMO) | Source: Ambulatory Visit | Attending: Radiation Oncology | Admitting: Radiation Oncology

## 2017-12-09 DIAGNOSIS — Z510 Encounter for antineoplastic radiation therapy: Secondary | ICD-10-CM | POA: Diagnosis not present

## 2017-12-09 DIAGNOSIS — C61 Malignant neoplasm of prostate: Secondary | ICD-10-CM | POA: Diagnosis present

## 2017-12-10 ENCOUNTER — Ambulatory Visit
Admission: RE | Admit: 2017-12-10 | Discharge: 2017-12-10 | Disposition: A | Discharge status: Home / Self Care | Payer: Managed Care, Other (non HMO) | Source: Ambulatory Visit | Attending: Radiation Oncology | Admitting: Radiation Oncology

## 2017-12-10 DIAGNOSIS — C61 Malignant neoplasm of prostate: Secondary | ICD-10-CM | POA: Diagnosis not present

## 2017-12-11 ENCOUNTER — Ambulatory Visit
Admission: RE | Admit: 2017-12-11 | Discharge: 2017-12-11 | Disposition: A | Discharge status: Home / Self Care | Payer: Managed Care, Other (non HMO) | Source: Ambulatory Visit | Attending: Radiation Oncology | Admitting: Radiation Oncology

## 2017-12-11 DIAGNOSIS — C61 Malignant neoplasm of prostate: Secondary | ICD-10-CM | POA: Diagnosis not present

## 2017-12-12 ENCOUNTER — Ambulatory Visit
Admission: RE | Admit: 2017-12-12 | Discharge: 2017-12-12 | Disposition: A | Discharge status: Home / Self Care | Payer: Managed Care, Other (non HMO) | Source: Ambulatory Visit | Attending: Radiation Oncology | Admitting: Radiation Oncology

## 2017-12-12 DIAGNOSIS — C61 Malignant neoplasm of prostate: Secondary | ICD-10-CM | POA: Diagnosis not present

## 2017-12-15 ENCOUNTER — Ambulatory Visit
Admission: RE | Admit: 2017-12-15 | Discharge: 2017-12-15 | Disposition: A | Discharge status: Home / Self Care | Payer: Managed Care, Other (non HMO) | Source: Ambulatory Visit | Attending: Radiation Oncology | Admitting: Radiation Oncology

## 2017-12-15 DIAGNOSIS — C61 Malignant neoplasm of prostate: Secondary | ICD-10-CM | POA: Diagnosis not present

## 2017-12-15 NOTE — Progress Notes (Addendum)
Pt came to the clinic today stating dark diarrhea for the past 4 days, 2 times per day. Pt states cramping and aching. Pt denies having any nausea or vomiting. Pt states that he has not taken Imodium because years ago he drank the liquid version of Imodium and vomited. Advised pt get the Imodium gel caps and take 2 pills the first episode of diarrhea and then 1 there after. Advised that he can take up until the daily dose. Advised pt to stay away from fried , fatty foods and dairy products when he has episodes of diarrhea.  Pt verbalized understanding.  Advised pt that he can come back around during the week if he's still having diarrhea.

## 2017-12-16 ENCOUNTER — Ambulatory Visit
Admission: RE | Admit: 2017-12-16 | Discharge: 2017-12-16 | Disposition: A | Discharge status: Home / Self Care | Payer: Managed Care, Other (non HMO) | Source: Ambulatory Visit | Attending: Radiation Oncology | Admitting: Radiation Oncology

## 2017-12-16 DIAGNOSIS — C61 Malignant neoplasm of prostate: Secondary | ICD-10-CM | POA: Diagnosis not present

## 2017-12-17 ENCOUNTER — Ambulatory Visit
Admission: RE | Admit: 2017-12-17 | Discharge: 2017-12-17 | Disposition: A | Discharge status: Home / Self Care | Payer: Managed Care, Other (non HMO) | Source: Ambulatory Visit | Attending: Radiation Oncology | Admitting: Radiation Oncology

## 2017-12-17 DIAGNOSIS — C61 Malignant neoplasm of prostate: Secondary | ICD-10-CM | POA: Diagnosis not present

## 2017-12-18 ENCOUNTER — Ambulatory Visit
Admission: RE | Admit: 2017-12-18 | Discharge: 2017-12-18 | Disposition: A | Discharge status: Home / Self Care | Payer: Managed Care, Other (non HMO) | Source: Ambulatory Visit | Attending: Radiation Oncology | Admitting: Radiation Oncology

## 2017-12-18 DIAGNOSIS — C61 Malignant neoplasm of prostate: Secondary | ICD-10-CM | POA: Diagnosis not present

## 2017-12-19 ENCOUNTER — Ambulatory Visit
Admission: RE | Admit: 2017-12-19 | Discharge: 2017-12-19 | Disposition: A | Discharge status: Home / Self Care | Payer: Managed Care, Other (non HMO) | Source: Ambulatory Visit | Attending: Radiation Oncology | Admitting: Radiation Oncology

## 2017-12-19 DIAGNOSIS — C61 Malignant neoplasm of prostate: Secondary | ICD-10-CM | POA: Diagnosis not present

## 2017-12-22 ENCOUNTER — Ambulatory Visit
Admission: RE | Admit: 2017-12-22 | Discharge: 2017-12-22 | Disposition: A | Discharge status: Home / Self Care | Payer: Managed Care, Other (non HMO) | Source: Ambulatory Visit | Attending: Radiation Oncology | Admitting: Radiation Oncology

## 2017-12-22 DIAGNOSIS — C61 Malignant neoplasm of prostate: Secondary | ICD-10-CM | POA: Diagnosis not present

## 2017-12-23 ENCOUNTER — Ambulatory Visit
Admission: RE | Admit: 2017-12-23 | Discharge: 2017-12-23 | Disposition: A | Discharge status: Home / Self Care | Payer: Managed Care, Other (non HMO) | Source: Ambulatory Visit | Attending: Radiation Oncology | Admitting: Radiation Oncology

## 2017-12-23 DIAGNOSIS — C61 Malignant neoplasm of prostate: Secondary | ICD-10-CM | POA: Diagnosis not present

## 2017-12-24 ENCOUNTER — Ambulatory Visit
Admission: RE | Admit: 2017-12-24 | Discharge: 2017-12-24 | Disposition: A | Discharge status: Home / Self Care | Payer: Managed Care, Other (non HMO) | Source: Ambulatory Visit | Attending: Radiation Oncology | Admitting: Radiation Oncology

## 2017-12-24 DIAGNOSIS — C61 Malignant neoplasm of prostate: Secondary | ICD-10-CM | POA: Diagnosis not present

## 2017-12-25 ENCOUNTER — Ambulatory Visit
Admission: RE | Admit: 2017-12-25 | Discharge: 2017-12-25 | Disposition: A | Discharge status: Home / Self Care | Payer: Managed Care, Other (non HMO) | Source: Ambulatory Visit | Attending: Radiation Oncology | Admitting: Radiation Oncology

## 2017-12-25 DIAGNOSIS — C61 Malignant neoplasm of prostate: Secondary | ICD-10-CM | POA: Diagnosis not present

## 2017-12-26 ENCOUNTER — Ambulatory Visit
Admission: RE | Admit: 2017-12-26 | Discharge: 2017-12-26 | Disposition: A | Discharge status: Home / Self Care | Payer: Managed Care, Other (non HMO) | Source: Ambulatory Visit | Attending: Radiation Oncology | Admitting: Radiation Oncology

## 2017-12-26 DIAGNOSIS — C61 Malignant neoplasm of prostate: Secondary | ICD-10-CM | POA: Diagnosis not present

## 2017-12-29 ENCOUNTER — Ambulatory Visit
Admission: RE | Admit: 2017-12-29 | Discharge: 2017-12-29 | Disposition: A | Discharge status: Home / Self Care | Payer: Managed Care, Other (non HMO) | Source: Ambulatory Visit | Attending: Radiation Oncology | Admitting: Radiation Oncology

## 2017-12-29 DIAGNOSIS — C61 Malignant neoplasm of prostate: Secondary | ICD-10-CM | POA: Diagnosis not present

## 2017-12-30 ENCOUNTER — Ambulatory Visit
Admission: RE | Admit: 2017-12-30 | Discharge: 2017-12-30 | Disposition: A | Discharge status: Home / Self Care | Payer: Managed Care, Other (non HMO) | Source: Ambulatory Visit | Attending: Radiation Oncology | Admitting: Radiation Oncology

## 2017-12-30 DIAGNOSIS — C61 Malignant neoplasm of prostate: Secondary | ICD-10-CM | POA: Diagnosis not present

## 2017-12-31 ENCOUNTER — Ambulatory Visit
Admission: RE | Admit: 2017-12-31 | Discharge: 2017-12-31 | Disposition: A | Discharge status: Home / Self Care | Payer: Managed Care, Other (non HMO) | Source: Ambulatory Visit | Attending: Radiation Oncology | Admitting: Radiation Oncology

## 2017-12-31 DIAGNOSIS — C61 Malignant neoplasm of prostate: Secondary | ICD-10-CM | POA: Diagnosis not present

## 2018-01-01 ENCOUNTER — Ambulatory Visit
Admission: RE | Admit: 2018-01-01 | Discharge: 2018-01-01 | Disposition: A | Discharge status: Home / Self Care | Payer: Managed Care, Other (non HMO) | Source: Ambulatory Visit | Attending: Radiation Oncology | Admitting: Radiation Oncology

## 2018-01-01 DIAGNOSIS — C61 Malignant neoplasm of prostate: Secondary | ICD-10-CM | POA: Diagnosis not present

## 2018-01-02 ENCOUNTER — Ambulatory Visit
Admission: RE | Admit: 2018-01-02 | Discharge: 2018-01-02 | Disposition: A | Discharge status: Home / Self Care | Payer: Managed Care, Other (non HMO) | Source: Ambulatory Visit | Attending: Radiation Oncology | Admitting: Radiation Oncology

## 2018-01-02 DIAGNOSIS — C61 Malignant neoplasm of prostate: Secondary | ICD-10-CM | POA: Diagnosis not present

## 2018-01-05 ENCOUNTER — Ambulatory Visit
Admission: RE | Admit: 2018-01-05 | Discharge: 2018-01-05 | Disposition: A | Discharge status: Home / Self Care | Payer: Managed Care, Other (non HMO) | Source: Ambulatory Visit | Attending: Radiation Oncology | Admitting: Radiation Oncology

## 2018-01-05 DIAGNOSIS — C61 Malignant neoplasm of prostate: Secondary | ICD-10-CM | POA: Diagnosis not present

## 2018-01-06 ENCOUNTER — Ambulatory Visit
Admission: RE | Admit: 2018-01-06 | Discharge: 2018-01-06 | Disposition: A | Discharge status: Home / Self Care | Payer: Managed Care, Other (non HMO) | Source: Ambulatory Visit | Attending: Radiation Oncology | Admitting: Radiation Oncology

## 2018-01-06 DIAGNOSIS — C61 Malignant neoplasm of prostate: Secondary | ICD-10-CM | POA: Diagnosis not present

## 2018-01-07 ENCOUNTER — Ambulatory Visit
Admission: RE | Admit: 2018-01-07 | Discharge: 2018-01-07 | Disposition: A | Discharge status: Home / Self Care | Payer: Managed Care, Other (non HMO) | Source: Ambulatory Visit | Attending: Radiation Oncology | Admitting: Radiation Oncology

## 2018-01-07 DIAGNOSIS — C61 Malignant neoplasm of prostate: Secondary | ICD-10-CM | POA: Diagnosis not present

## 2018-01-08 ENCOUNTER — Ambulatory Visit
Admission: RE | Admit: 2018-01-08 | Discharge: 2018-01-08 | Disposition: A | Discharge status: Home / Self Care | Payer: Managed Care, Other (non HMO) | Source: Ambulatory Visit | Attending: Radiation Oncology | Admitting: Radiation Oncology

## 2018-01-08 DIAGNOSIS — C61 Malignant neoplasm of prostate: Secondary | ICD-10-CM | POA: Diagnosis not present

## 2018-01-09 ENCOUNTER — Ambulatory Visit
Admission: RE | Admit: 2018-01-09 | Discharge: 2018-01-09 | Disposition: A | Discharge status: Home / Self Care | Payer: Managed Care, Other (non HMO) | Source: Ambulatory Visit | Attending: Radiation Oncology | Admitting: Radiation Oncology

## 2018-01-09 DIAGNOSIS — C61 Malignant neoplasm of prostate: Secondary | ICD-10-CM | POA: Insufficient documentation

## 2018-01-09 DIAGNOSIS — Z510 Encounter for antineoplastic radiation therapy: Secondary | ICD-10-CM | POA: Diagnosis not present

## 2018-01-12 ENCOUNTER — Ambulatory Visit
Admission: RE | Admit: 2018-01-12 | Discharge: 2018-01-12 | Disposition: A | Discharge status: Home / Self Care | Payer: Managed Care, Other (non HMO) | Source: Ambulatory Visit | Attending: Radiation Oncology | Admitting: Radiation Oncology

## 2018-01-12 DIAGNOSIS — C61 Malignant neoplasm of prostate: Secondary | ICD-10-CM | POA: Diagnosis not present

## 2018-01-13 ENCOUNTER — Ambulatory Visit
Admission: RE | Admit: 2018-01-13 | Discharge: 2018-01-13 | Disposition: A | Discharge status: Home / Self Care | Payer: Managed Care, Other (non HMO) | Source: Ambulatory Visit | Attending: Radiation Oncology | Admitting: Radiation Oncology

## 2018-01-13 DIAGNOSIS — C61 Malignant neoplasm of prostate: Secondary | ICD-10-CM | POA: Diagnosis not present

## 2018-01-14 ENCOUNTER — Encounter: Payer: Self-pay | Admitting: Radiation Oncology

## 2018-01-14 ENCOUNTER — Ambulatory Visit
Admission: RE | Admit: 2018-01-14 | Discharge: 2018-01-14 | Disposition: A | Discharge status: Home / Self Care | Payer: Managed Care, Other (non HMO) | Source: Ambulatory Visit | Attending: Radiation Oncology | Admitting: Radiation Oncology

## 2018-01-14 DIAGNOSIS — C61 Malignant neoplasm of prostate: Secondary | ICD-10-CM

## 2018-01-14 NOTE — Progress Notes (Signed)
Patient qualifies for lung cancer screening.  I will refer to Wyatt [name].

## 2018-01-15 ENCOUNTER — Ambulatory Visit
Admission: RE | Admit: 2018-01-15 | Discharge: 2018-01-15 | Disposition: A | Discharge status: Home / Self Care | Payer: Managed Care, Other (non HMO) | Source: Ambulatory Visit | Attending: Radiation Oncology | Admitting: Radiation Oncology

## 2018-01-15 DIAGNOSIS — C61 Malignant neoplasm of prostate: Secondary | ICD-10-CM | POA: Diagnosis not present

## 2018-01-16 ENCOUNTER — Ambulatory Visit
Admission: RE | Admit: 2018-01-16 | Discharge: 2018-01-16 | Disposition: A | Discharge status: Home / Self Care | Payer: Managed Care, Other (non HMO) | Source: Ambulatory Visit | Attending: Radiation Oncology | Admitting: Radiation Oncology

## 2018-01-16 DIAGNOSIS — C61 Malignant neoplasm of prostate: Secondary | ICD-10-CM | POA: Diagnosis not present

## 2018-01-19 ENCOUNTER — Ambulatory Visit
Admission: RE | Admit: 2018-01-19 | Discharge: 2018-01-19 | Disposition: A | Discharge status: Home / Self Care | Payer: Managed Care, Other (non HMO) | Source: Ambulatory Visit | Attending: Radiation Oncology | Admitting: Radiation Oncology

## 2018-01-19 DIAGNOSIS — C61 Malignant neoplasm of prostate: Secondary | ICD-10-CM | POA: Diagnosis not present

## 2018-01-20 ENCOUNTER — Ambulatory Visit
Admission: RE | Admit: 2018-01-20 | Discharge: 2018-01-20 | Disposition: A | Discharge status: Home / Self Care | Payer: Managed Care, Other (non HMO) | Source: Ambulatory Visit | Attending: Radiation Oncology | Admitting: Radiation Oncology

## 2018-01-20 ENCOUNTER — Telehealth: Payer: Self-pay | Admitting: Pulmonary Disease

## 2018-01-20 DIAGNOSIS — C61 Malignant neoplasm of prostate: Secondary | ICD-10-CM | POA: Diagnosis not present

## 2018-01-21 ENCOUNTER — Ambulatory Visit
Admission: RE | Admit: 2018-01-21 | Discharge: 2018-01-21 | Disposition: A | Discharge status: Home / Self Care | Payer: Managed Care, Other (non HMO) | Source: Ambulatory Visit | Attending: Radiation Oncology | Admitting: Radiation Oncology

## 2018-01-21 DIAGNOSIS — C61 Malignant neoplasm of prostate: Secondary | ICD-10-CM | POA: Diagnosis not present

## 2018-01-22 ENCOUNTER — Ambulatory Visit
Admission: RE | Admit: 2018-01-22 | Discharge: 2018-01-22 | Disposition: A | Discharge status: Home / Self Care | Payer: Managed Care, Other (non HMO) | Source: Ambulatory Visit | Attending: Radiation Oncology | Admitting: Radiation Oncology

## 2018-01-22 DIAGNOSIS — C61 Malignant neoplasm of prostate: Secondary | ICD-10-CM | POA: Diagnosis not present

## 2018-01-22 NOTE — Telephone Encounter (Signed)
LMTC x 1  

## 2018-01-22 NOTE — Telephone Encounter (Signed)
Spoke with pt and and advised that due to Prostate cancerdx in 08/2017 he will not qualify for lung screening program. PT verbalized understanding and stated that his PCP, Dr Alyson Ingles has decided to order a chest ct on him,.  Letter sent to Dr Tammi Klippel to make him aware that referral has been cancelled.

## 2018-01-23 ENCOUNTER — Ambulatory Visit
Admission: RE | Admit: 2018-01-23 | Discharge: 2018-01-23 | Disposition: A | Discharge status: Home / Self Care | Payer: Managed Care, Other (non HMO) | Source: Ambulatory Visit | Attending: Radiation Oncology | Admitting: Radiation Oncology

## 2018-01-23 ENCOUNTER — Other Ambulatory Visit: Payer: Self-pay | Admitting: Family Medicine

## 2018-01-23 DIAGNOSIS — R05 Cough: Secondary | ICD-10-CM

## 2018-01-23 DIAGNOSIS — C61 Malignant neoplasm of prostate: Secondary | ICD-10-CM | POA: Diagnosis not present

## 2018-01-23 DIAGNOSIS — R059 Cough, unspecified: Secondary | ICD-10-CM

## 2018-01-23 DIAGNOSIS — F17200 Nicotine dependence, unspecified, uncomplicated: Secondary | ICD-10-CM

## 2018-01-23 DIAGNOSIS — J449 Chronic obstructive pulmonary disease, unspecified: Secondary | ICD-10-CM

## 2018-01-26 ENCOUNTER — Encounter: Payer: Self-pay | Admitting: Radiation Oncology

## 2018-01-26 NOTE — Progress Notes (Signed)
  Radiation Oncology         [phone] ________________________________  Name: Wyatt [name] MRN: [id number]  Date: 01/26/2018  DOB: 1961/01/01  End of Treatment Note  Diagnosis:   57 y.o. 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     Indication for treatment:  Curative, Definitive Radiotherapy       Radiation treatment dates:   12/01/17 - 01/23/18  Site/dose:   The prostate was treated to 45 Gy in 25 fractions of 1.8 Gy, followed by a boost of 30 Gy in 15 fractions of 2 Gy, for a total dose of 75 Gy.  Beams/energy:   The patient was treated with IMRT using volumetric arc therapy delivering 6 MV X-rays to clockwise and counterclockwise circumferential arcs with a 90 degree collimator offset to avoid dose scalloping.  Image guidance was performed with daily cone beam CT prior to each fraction to align to gold markers in the prostate and assure proper bladder and rectal fill volumes.  Immobilization was achieved with BodyFix custom mold.  Narrative: The patient tolerated radiation treatment relatively well.  He experienced some minor urinary irritation and modest fatigue with increased nocturia, urgency,  dysuria, incomplete bladder emptying, and bladder spasms which were relieved with use of Flomax. He experienced diarrhea which was managed with Imodium as needed. He reported leakage at the beginning of treatments that had resolved by the completion of treatment.   Plan: The patient has completed radiation treatment. He will return to radiation oncology clinic for routine followup in one month. I advised him to call or return sooner if he has any questions or concerns related to his recovery or treatment. ________________________________  Sheral Apley. Tammi Klippel, M.D.  This document serves as a record of services personally performed by Tyler Pita, MD. It was created on his behalf by Wilburn Mylar, a trained medical scribe.  The creation of this record is based on the scribe's personal observations and the provider's statements to them. This document has been checked and approved by the attending provider.

## 2018-01-29 ENCOUNTER — Ambulatory Visit
Admission: RE | Admit: 2018-01-29 | Discharge: 2018-01-29 | Disposition: A | Discharge status: Home / Self Care | Payer: Managed Care, Other (non HMO) | Source: Ambulatory Visit | Attending: Family Medicine | Admitting: Family Medicine

## 2018-01-29 DIAGNOSIS — R05 Cough: Secondary | ICD-10-CM

## 2018-01-29 DIAGNOSIS — F17200 Nicotine dependence, unspecified, uncomplicated: Secondary | ICD-10-CM

## 2018-01-29 DIAGNOSIS — J449 Chronic obstructive pulmonary disease, unspecified: Secondary | ICD-10-CM

## 2018-01-29 DIAGNOSIS — R059 Cough, unspecified: Secondary | ICD-10-CM

## 2018-03-12 ENCOUNTER — Other Ambulatory Visit: Payer: Self-pay

## 2018-03-12 ENCOUNTER — Ambulatory Visit
Admission: RE | Admit: 2018-03-12 | Discharge: 2018-03-12 | Disposition: A | Discharge status: Home / Self Care | Payer: PRIVATE HEALTH INSURANCE | Source: Ambulatory Visit | Attending: Urology | Admitting: Urology

## 2018-03-12 ENCOUNTER — Encounter: Payer: Self-pay | Admitting: Urology

## 2018-03-12 VITALS — BP 125/88 | HR 76 | Temp 99.1°F | Resp 18 | Ht 69.5 in | Wt 201.5 lb

## 2018-03-12 DIAGNOSIS — C61 Malignant neoplasm of prostate: Secondary | ICD-10-CM

## 2018-03-12 DIAGNOSIS — Z923 Personal history of irradiation: Secondary | ICD-10-CM | POA: Insufficient documentation

## 2018-03-12 NOTE — Progress Notes (Signed)
Radiation Oncology         [phone] ________________________________  Name: Wyatt [name] MRN: [id number]  Date: 03/12/2018  DOB: 08-12-1960  Post Treatment Note  CC: Maury Dus, MD  Festus Aloe, MD  Diagnosis:   58 y.o.gentleman with Stage T2badenocarcinoma of the prostate with Gleason Score of 4+5, and PSA of3.89.     Interval Since Last Radiation:  7 weeks  12/01/17 - 01/23/18:  The prostate was treated to 45 Gy in 25 fractions of 1.8 Gy, followed by a boost of 30 Gy in 15 fractions of 2 Gy, for a total dose of 75 Gy.  Narrative:  The patient returns today for routine follow-up.  He tolerated radiation treatment relatively well.  He experienced some minor urinary irritation and modest fatigue with increased nocturia, urgency,  dysuria, incomplete bladder emptying, and bladder spasms which were relieved with use of Flomax. He experienced diarrhea which was managed with Imodium as needed. He reported leakage at the beginning of treatments that had resolved by the completion of treatment.                               On review of systems, the patient states that he is doing very well overall.  He reports almost complete resolution of his LUTS.  He is no longer requiring the use of Flomax and reports a strong, steady stream and feels that he is emptying his bladder well on voiding.  He continues with nocturia but denies weak stream, straining to void, dysuria, excessive daytime frequency, urgency or incontinence.  He reports a healthy appetite and is maintaining his weight.  He denies abdominal pain, nausea, vomiting, diarrhea or constipation.  Overall, he is quite pleased with his progress to date.  ALLERGIES:  is allergic to aspirin.  Meds: Current Outpatient Medications  Medication Sig Dispense Refill  . ANORO ELLIPTA 62.5-25 MCG/INH AEPB INHALE 1 PUFF BY MOUTH ONCE DAILY--  in pm  2  . cephALEXin (KEFLEX) 500 MG capsule Take 1 capsule (500 mg total) by mouth at bedtime.  5 capsule 0  . Multiple Vitamins-Minerals (CENTRUM SILVER PO) Take 1 tablet by mouth daily.     Marland Kitchen oxyCODONE-acetaminophen (PERCOCET) 5-325 MG tablet Take 1 tablet by mouth every 6 (six) hours as needed for severe pain. 12 tablet 0  . Sodium Bicarbonate-Citric Acid (ALKA-SELTZER HEARTBURN PO) Take by mouth as needed.    . tamsulosin (FLOMAX) 0.4 MG CAPS capsule Take 0.4 mg by mouth daily after breakfast.      No current facility-administered medications for this encounter.     Physical Findings:  height is 5' 9.5" (1.765 m) and weight is 201 lb 8 oz (91.4 kg). His oral temperature is 99.1 F (37.3 C). His blood pressure is 125/88 and his pulse is 76. His respiration is 18 and oxygen saturation is 97%.  Pain Assessment Pain Score: 0-No pain/10 In general this is a well appearing Caucasian male in no acute distress.  He's alert and oriented x4 and appropriate throughout the examination. Cardiopulmonary assessment is negative for acute distress and he exhibits normal effort.   Lab Findings: Lab Results  Component Value Date   WBC 7.8 01/05/2007   HGB 17.5 (H) 01/05/2007   HCT 49.6 01/05/2007   MCV 89.4 01/05/2007   PLT 202 01/05/2007     Radiographic Findings: No results found.  Impression/Plan: 1. 58 y.o.gentleman with Stage T2badenocarcinoma of the prostate with Gleason  Score of 4+5, and PSA of3.89.   He will continue to follow up with urology for ongoing PSA determinations and has an appointment scheduled with Dr. Junious Silk on 04/13/2018.  He continues to tolerate the ADT well and anticipates completing a full 2-year course of treatment.  He understands what to expect with regards to PSA monitoring going forward. I will look forward to following his response to treatment via correspondence with urology, and would be happy to continue to participate in his care if clinically indicated. I talked to the patient about what to expect in the future, including his risk for erectile  dysfunction and rectal bleeding. I encouraged him to call or return to the office if he has any questions regarding his previous radiation or possible radiation side effects. He was comfortable with this plan and will follow up as needed.    Nicholos Johns, PA-C

## 2020-02-06 IMAGING — DX DG CHEST 2V
2 series · 2 of 2 positions shown · non-contrast
Comparison: 12/10/2006

CLINICAL DATA: Shortness of breath with exertion for 2 years
worsened over past several months, smoker

EXAM:
CHEST - 2 VIEW

[dg chest 2 view (1 of 2)]
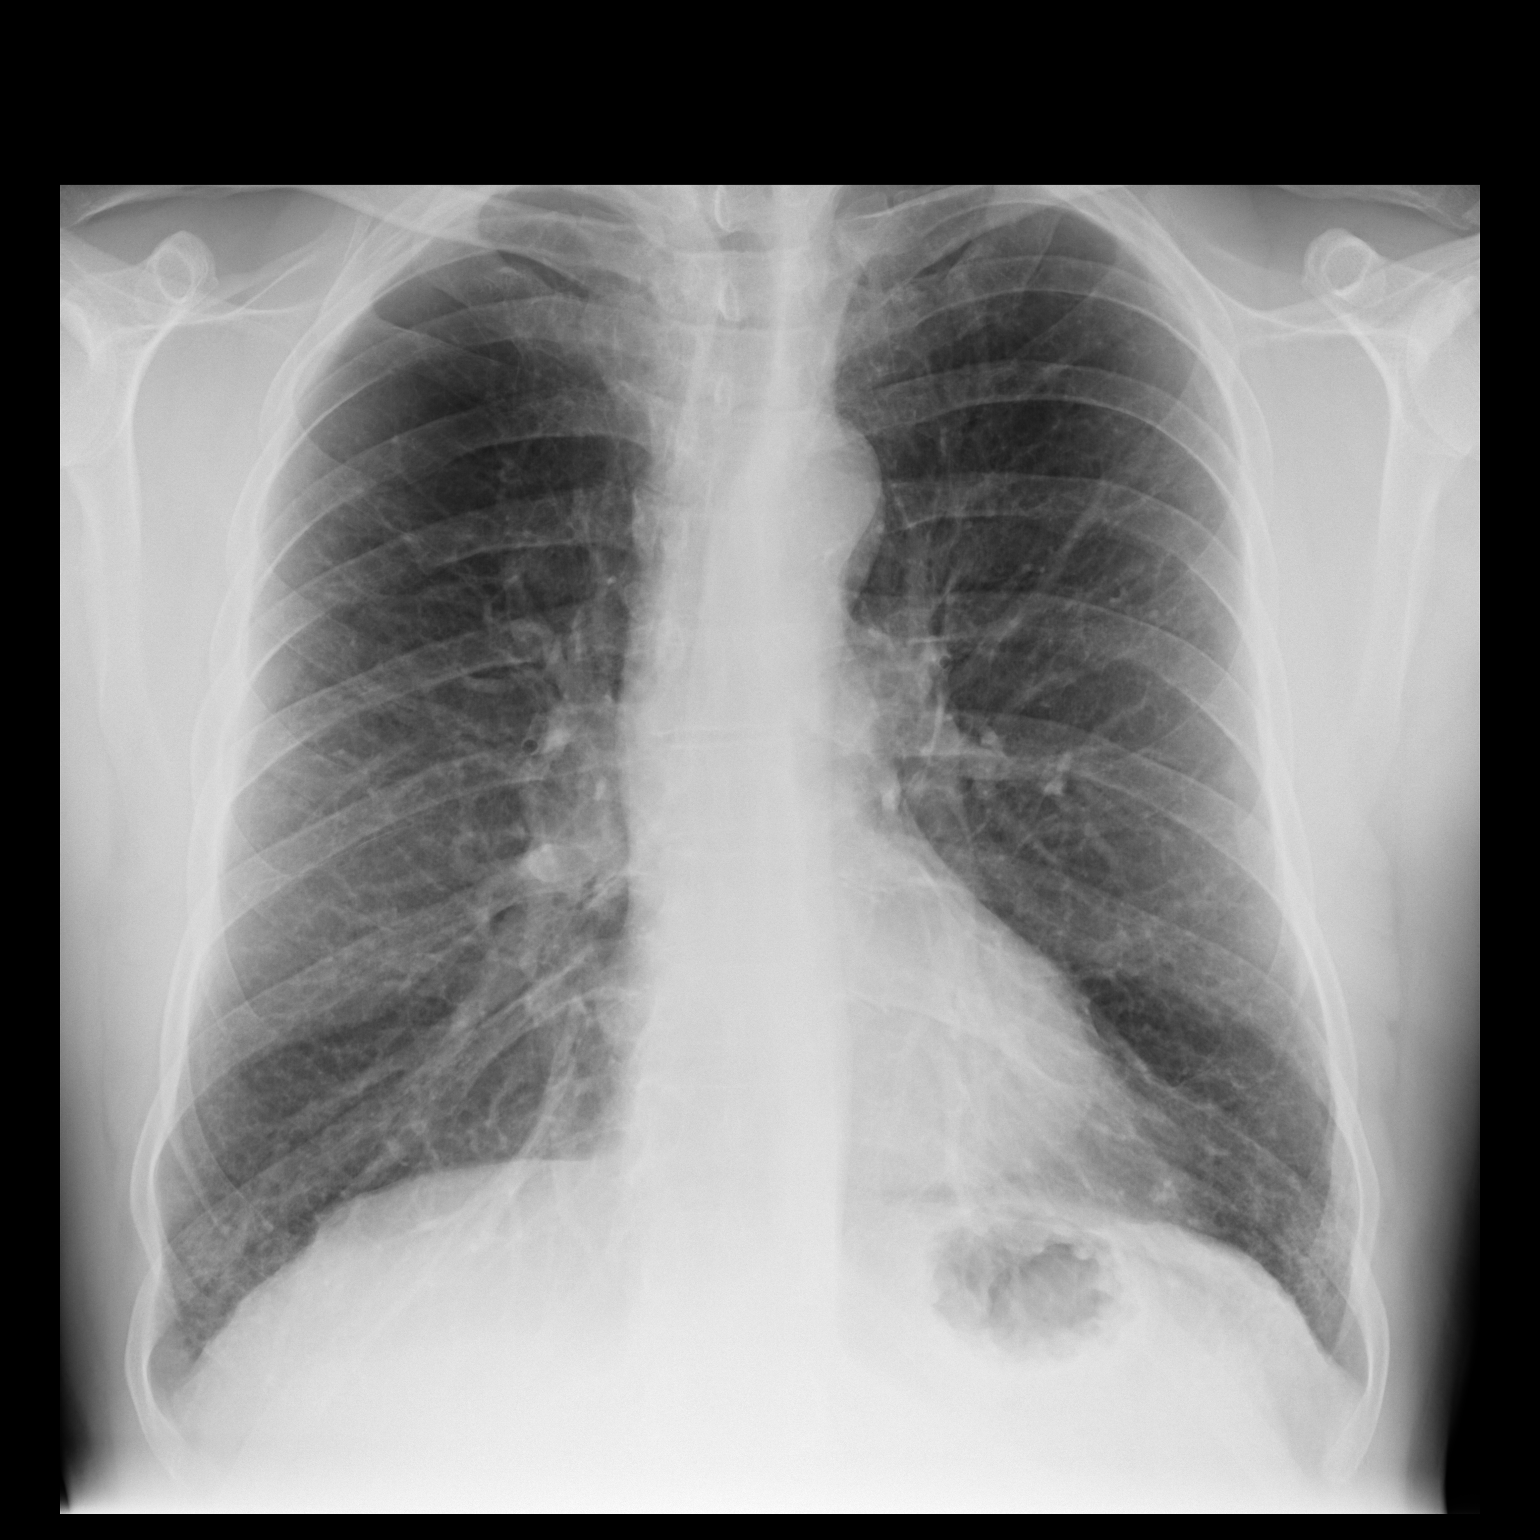

[dg chest 2 view (2 of 2)]
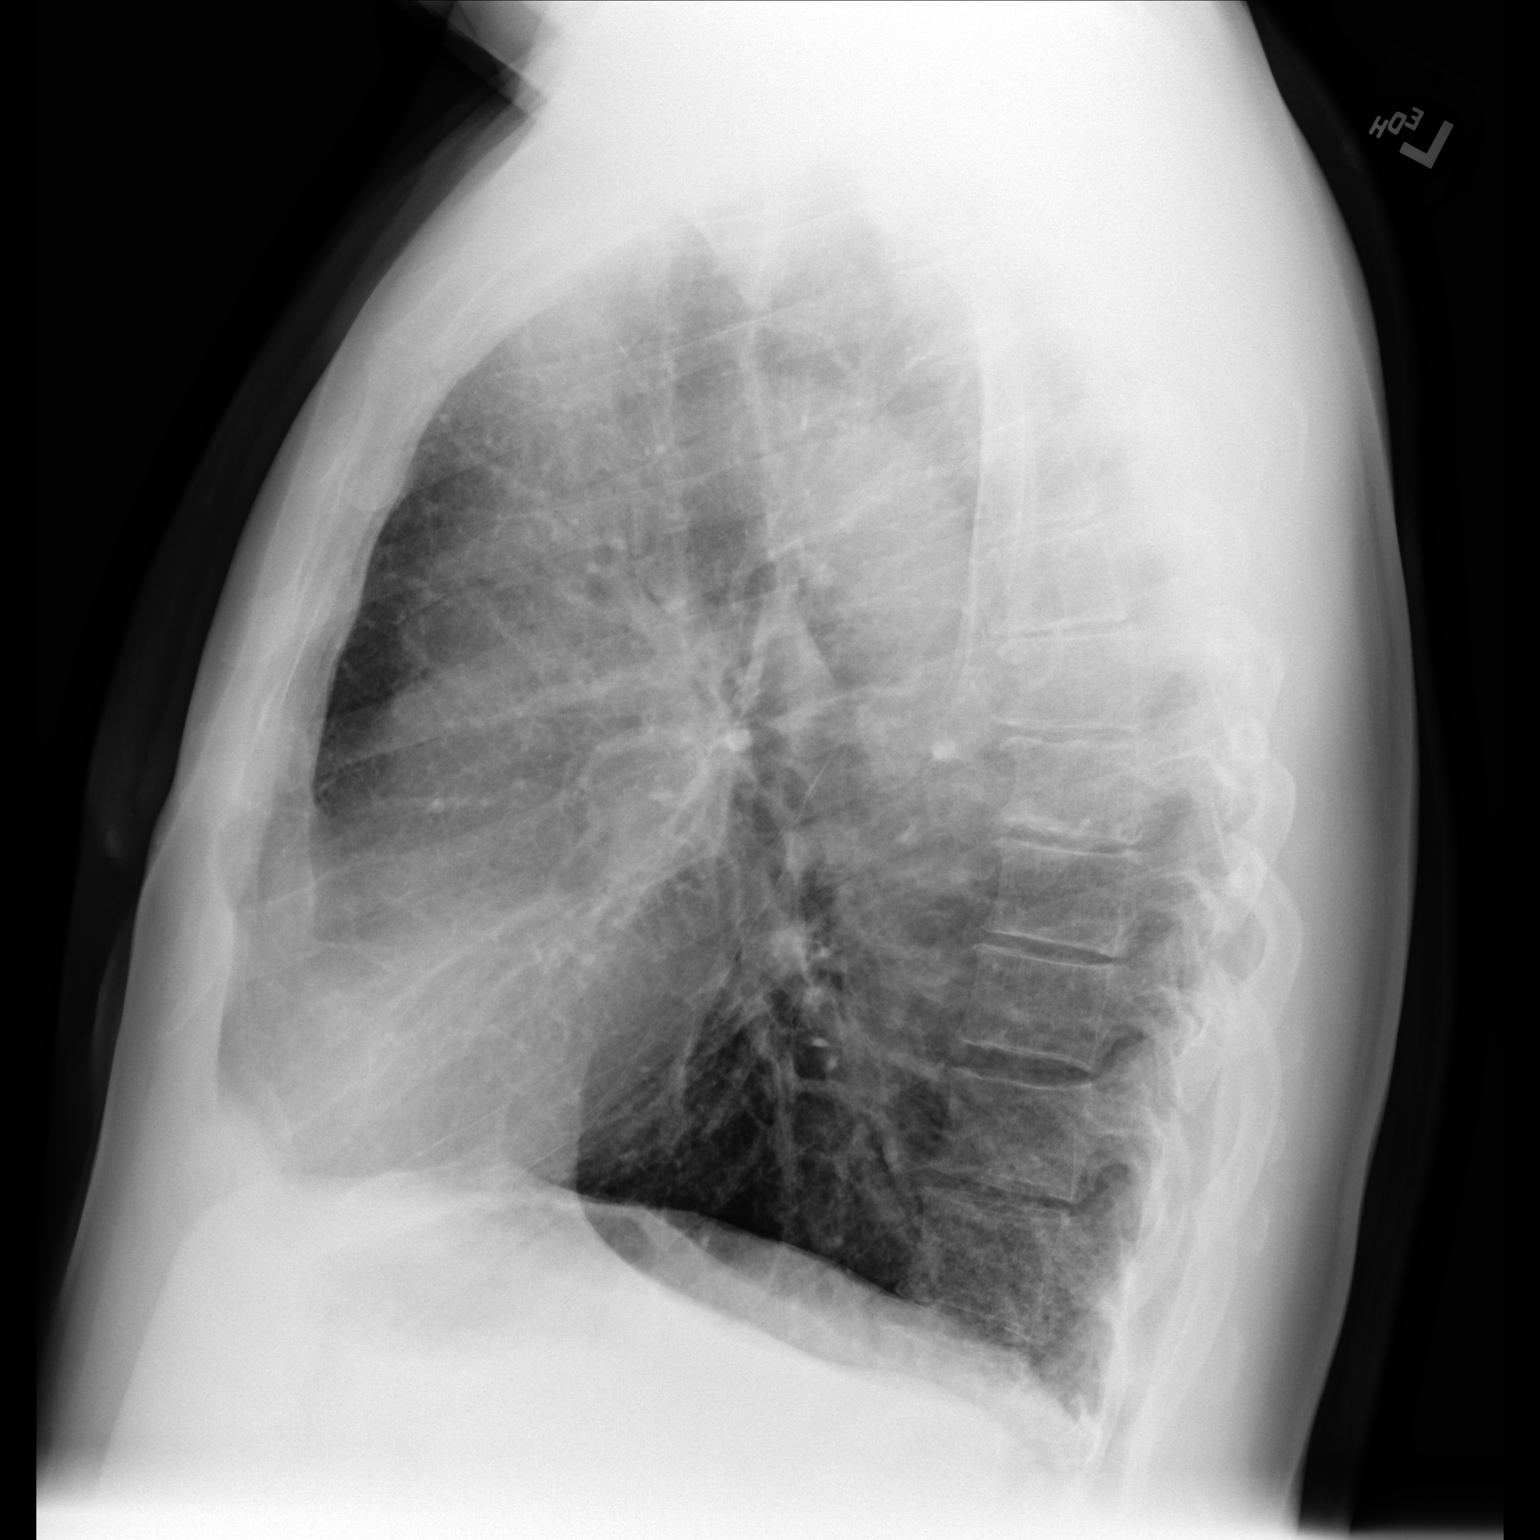

[2 of 2 positions shown; findings below may reference images not displayed]

FINDINGS: Normal heart size, mediastinal contours, and pulmonary vascularity.

Lungs mildly hyperinflated but clear.

No acute infiltrate, pleural effusion or pneumothorax.

Bones unremarkable.

Mild atherosclerotic calcification at aortic arch.
IMPRESSION: Pulmonary hyperinflation without acute infiltrate.

## 2020-03-03 IMAGING — NM NM BONE WHOLE BODY
2 series · 2 of 2 positions shown · non-contrast
Comparison: 08/26/2017 pelvic CT

CLINICAL DATA: 56-year-old male with prostate cancer.  PSA of 3.89.

EXAM:
NUCLEAR MEDICINE WHOLE BODY BONE SCAN
TECHNIQUE: Whole body anterior and posterior images were obtained approximately
3 hours after intravenous injection of radiopharmaceutical.
RADIOPHARMACEUTICALS:  20.9 mCi Hechnetium-YYm MDP IV

[Series 1: whole body · 2.66mm/px · 1 of 1 slices shown (1 of 2)]
[im 1/1]
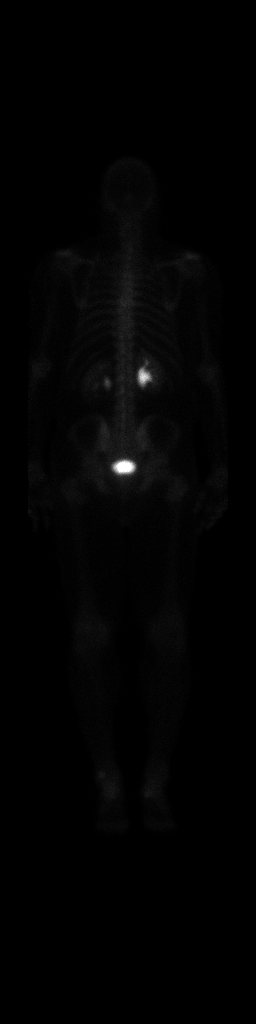

[Series 1: whole body · 2.66mm/px · 1 of 1 slices shown (2 of 2)]
[im 1/1]
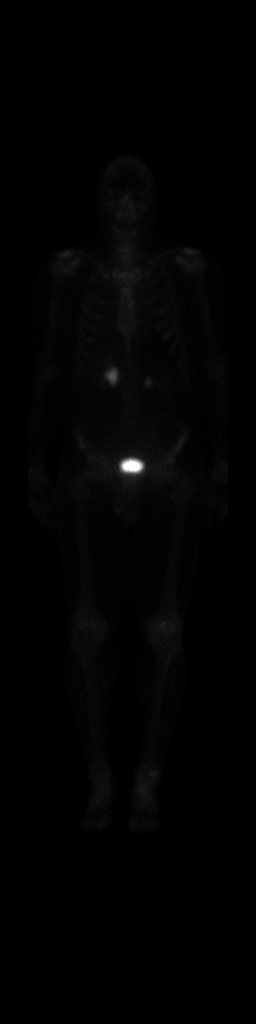

[2 of 2 positions shown; findings below may reference images not displayed]

FINDINGS: Increased focal activity overlying the LATERAL LEFT ankle would be
unusual place for solitary metastasis-correlate with radiographs.

No abnormal activity otherwise noted to suggest bony metastatic
disease.

Mild increased activity within both knees and the RIGHT foot are
compatible with degenerative changes.

RIGHT hydronephrosis versus fullness of the RIGHT renal pelvis
noted.
IMPRESSION: Increased focal activity overlying the LATERAL LEFT ankle which may
be posttraumatic or degenerative. This would be an unusual location
for a solitary metastasis, especially given patient's PSA level, but
plain film correlation is recommended.

No findings to suggest bony metastatic disease otherwise.

Question RIGHT hydronephrosis versus RIGHT renal pelvis fullness.
Consider ultrasound or CT as clinically indicated.

## 2021-06-20 DIAGNOSIS — J439 Emphysema, unspecified: Secondary | ICD-10-CM | POA: Diagnosis not present

## 2021-06-20 DIAGNOSIS — R350 Frequency of micturition: Secondary | ICD-10-CM | POA: Diagnosis not present

## 2021-06-20 DIAGNOSIS — C61 Malignant neoplasm of prostate: Secondary | ICD-10-CM | POA: Diagnosis not present

## 2021-06-20 DIAGNOSIS — F419 Anxiety disorder, unspecified: Secondary | ICD-10-CM | POA: Diagnosis not present

## 2021-06-20 DIAGNOSIS — R3915 Urgency of urination: Secondary | ICD-10-CM | POA: Diagnosis not present

## 2021-06-20 DIAGNOSIS — F17210 Nicotine dependence, cigarettes, uncomplicated: Secondary | ICD-10-CM | POA: Diagnosis not present

## 2021-06-29 DIAGNOSIS — Z87891 Personal history of nicotine dependence: Secondary | ICD-10-CM | POA: Diagnosis not present

## 2021-06-29 DIAGNOSIS — Z122 Encounter for screening for malignant neoplasm of respiratory organs: Secondary | ICD-10-CM | POA: Diagnosis not present

## 2021-06-29 DIAGNOSIS — F17210 Nicotine dependence, cigarettes, uncomplicated: Secondary | ICD-10-CM | POA: Diagnosis not present

## 2021-07-10 DIAGNOSIS — N1330 Unspecified hydronephrosis: Secondary | ICD-10-CM | POA: Diagnosis not present

## 2021-07-10 DIAGNOSIS — N281 Cyst of kidney, acquired: Secondary | ICD-10-CM | POA: Diagnosis not present

## 2021-07-10 DIAGNOSIS — N2882 Megaloureter: Secondary | ICD-10-CM | POA: Diagnosis not present

## 2021-07-19 ENCOUNTER — Ambulatory Visit: Payer: BC Managed Care – PPO | Admitting: Pulmonary Disease

## 2021-07-19 ENCOUNTER — Encounter: Payer: Self-pay | Admitting: Pulmonary Disease

## 2021-07-19 VITALS — BP 142/90 | HR 94 | Temp 97.8°F | Ht 70.0 in | Wt 220.0 lb

## 2021-07-19 DIAGNOSIS — J449 Chronic obstructive pulmonary disease, unspecified: Secondary | ICD-10-CM

## 2021-07-19 DIAGNOSIS — J849 Interstitial pulmonary disease, unspecified: Secondary | ICD-10-CM | POA: Diagnosis not present

## 2021-07-19 LAB — CBC WITH DIFFERENTIAL/PLATELET
Basophils Absolute: 0.1 10*3/uL (ref 0.0–0.1)
Basophils Relative: 1.2 % (ref 0.0–3.0)
Eosinophils Absolute: 0.2 10*3/uL (ref 0.0–0.7)
Eosinophils Relative: 2.6 % (ref 0.0–5.0)
HCT: 48.3 % (ref 39.0–52.0)
Hemoglobin: 16.6 g/dL (ref 13.0–17.0)
Lymphocytes Relative: 17.5 % (ref 12.0–46.0)
Lymphs Abs: 1.3 10*3/uL (ref 0.7–4.0)
MCHC: 34.4 g/dL (ref 30.0–36.0)
MCV: 90.7 fl (ref 78.0–100.0)
Monocytes Absolute: 0.8 10*3/uL (ref 0.1–1.0)
Monocytes Relative: 10.7 % (ref 3.0–12.0)
Neutro Abs: 5.1 10*3/uL (ref 1.4–7.7)
Neutrophils Relative %: 68 % (ref 43.0–77.0)
Platelets: 197 10*3/uL (ref 150.0–400.0)
RBC: 5.33 Mil/uL (ref 4.22–5.81)
RDW: 13.7 % (ref 11.5–15.5)
WBC: 7.5 10*3/uL (ref 4.0–10.5)

## 2021-07-19 LAB — COMPREHENSIVE METABOLIC PANEL
ALT: 45 U/L (ref 0–53)
AST: 27 U/L (ref 0–37)
Albumin: 4.4 g/dL (ref 3.5–5.2)
Alkaline Phosphatase: 47 U/L (ref 39–117)
BUN: 15 mg/dL (ref 6–23)
CO2: 28 mEq/L (ref 19–32)
Calcium: 9.5 mg/dL (ref 8.4–10.5)
Chloride: 104 mEq/L (ref 96–112)
Creatinine, Ser: 1.4 mg/dL (ref 0.40–1.50)
GFR: 54.27 mL/min — ABNORMAL LOW (ref >60.00)
Glucose, Bld: 107 mg/dL — ABNORMAL HIGH (ref 70–99)
Potassium: 4 mEq/L (ref 3.5–5.1)
Sodium: 139 mEq/L (ref 135–145)
Total Bilirubin: 0.5 mg/dL (ref 0.2–1.2)
Total Protein: 7.2 g/dL (ref 6.0–8.3)

## 2021-07-19 MED ORDER — CETIRIZINE HCL 10 MG PO TABS: 10.0000 mg | ORAL_TABLET | Freq: Every day | ORAL | Status: AC

## 2021-07-19 MED ORDER — FLUTICASONE PROPIONATE 50 MCG/ACT NA SUSP: 1.0000 | Freq: Every day | NASAL | 2 refills | Status: DC

## 2021-07-19 NOTE — Patient Instructions (Signed)
Lab tests today ? ?Flonase 1 spray in each nostril daily ? ?Zyrtec 10 mg pill daily ? ?Trelegy one puff daily, and rinse your mouth after each use ? ?Albuterol two puffs every 6 hours as needed for cough, wheeze, chest congestion, or shortness of breath ? ?Please complete respiratory exposure packet and bring with you to your next appointment ? ?Will arrange for 6 minute walk test ? ?Will arrange for pulmonary function test and follow up in 6 weeks ?

## 2021-07-19 NOTE — Progress Notes (Signed)
Ref by Dr. Lavone Neri for COPD and emphysema  ?

## 2021-07-19 NOTE — Progress Notes (Signed)
? ?[facility] ? ?Chief Complaint  ?Patient presents with  ? Consult  ?  Ref by Dr. Jeryl Columbia for COPD and emphysema  ? ? ?Past Surgical History:  ?He  has a past surgical history that includes Prostate biopsy (08-13-2017     dr eskridge  office); Inguinal hernia repair (Right, 12-29-2000    dr t. price  [place]); Umbilical hernia repair (01-05-2007    dr Georgette Dover  [facility]); Tonsillectomy (age 27); and Thulium laser TURP (transurethral resection of prostate) (N/A, 10/14/2017). ? ?Past Medical History:  ?BPH, Prostate cancer, GERD, PPD positive ? ?Constitutional:  ?BP (!) 142/90 (BP Location: Left Arm, Patient Position: Sitting)   Pulse 94   Temp 97.8 ?F (36.6 ?C) (Temporal)   Ht '5\' 10"'$  (1.778 m)   Wt 220 lb 0.6 oz (99.8 kg)   SpO2 100% Comment: ra  BMI 31.57 kg/m?  ? ?Brief Summary:  ?Wyatt [name] is a 61 y.o. male smoker with emphysema and interstitial lung disease.  ?  ? ? ? ?Subjective:  ? ?He is from [place].  He lived in [place] for 2 years and worked as a Curator.  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  After moving back to [place] he started working in Education officer, museum.  He reports exposure to dust, fumes, chemicals, and welding.  He was in [employer] in the 1980's, but never stationed oversees.  He has several pet dogs, but no other animal exposures.  He had COVID in January 2023, but didn't need any specific therapy.  He continues to smoke 1 to 1.5 pack per day.  He has script for wellbutrin and plans to quit. ? ?He has noticed his breathing gradually getting worse over the past couple of years.  He can still keep up with activity, but feels more short of breath and takes longer to recover.  He has sinus congestion and post nasal drip, and gets a wheeze when he lays flat.  He has cough with clear sputum.  He denies gland swelling, skin rash, joint swelling, fever, weight loss, or difficulty  swallowing. ? ?He has been using trelegy and this helps.  Albuterol helps also.  He has been using mucinex and zyrtec for sinus congestion. ? ?His mother had lung cancer.  No other family history of lung disease.   ? ?CT chest from 2019 showed changes of emphysema and pulmonary fibrosis.  Lung cancer screening CT from April 2023 showed similar, but progressed findings. ? ?Physical Exam:  ? ?Appearance - well kempt  ? ?ENMT - no sinus tenderness, no oral exudate, no LAN, Mallampati 3 airway, no stridor, wear dentures ? ?Respiratory - equal breath sounds bilaterally, no wheezing or rales ? ?CV - s1s2 regular rate and rhythm, no murmurs ? ?Ext - no clubbing, no edema ? ?Skin - no rashes ? ?Psych - normal mood and affect ?  ?Pulmonary testing:  ? ? ?Chest Imaging:  ?CT chest 01/29/18 >> atherosclerosis, moderate centrilobular and paraseptal emphysema, fibrosis at lung bases, lower lobe BTX ?LDCT chest 07/01/21 >> Atherosclerosis, severe centrilobular emphysema, bronchial thickening; 3.8 mm nodule RLL, Lt granuloma; mod patchy subpleural reticulation and GGO with basilar predominance, mild traction bronchiolectasis (possible UIP); fatty liver, Rt hydronephrosis ? ?Social History:  ?He  reports that he has been smoking cigarettes. He has a 37.50 pack-year smoking history. He has never used smokeless tobacco. He reports current alcohol use. ? ?  Family History:  ?His family history includes Alzheimer's disease in his mother; Bladder Cancer in his cousin; Esophageal cancer in his maternal grandfather; Kidney Stones in his brother; Lung cancer in his mother. ?  ? 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 ? ?Assessment/Plan:  ? ?Centrilobular, paraseptal emphysema with chronic bronchitis. ?- will arrange for pulmonary function test to assess for airflow obstruction ?- continue trelegy 100 one puff daily ?- prn albuterol ? 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 ?- will check CMET, CBC, ANA, RF, SSA/SSB, SCL 70, IgE, and PFT ?- will arrange for 6 minute walk test ?- provided him with ILD packet to complete and bring with him to his next appointment ? ?Upper airway cough syndrome. ?- add flonase daily ?- continue zyrtec ?- prn albuterol ? ?3.8 mm Rt lower lobe nodule. ?- will need follow up CT imaging in April 2024 ? ?Tobacco abuse. ?- encouraged him to follow through with plan for smoking cessation using wellbutrin ? ?Time Spent Involved in Patient Care on Day of Examination:  ?51 minutes ? ?Follow up:  ? ?Patient Instructions  ?Lab tests today ? ?Flonase 1 spray in each nostril daily ? ?Zyrtec 10 mg pill daily ? ?Trelegy one puff daily, and rinse your mouth after each use ? ?Albuterol two puffs every 6 hours as needed for cough, wheeze, chest congestion, or shortness of breath ? ?Please complete respiratory exposure packet and bring with you to your next appointment ? ?Will arrange for 6 minute walk test ? ?Will arrange for pulmonary function test and follow up in 6 weeks ? ?Medication List:  ? ?Allergies as of 07/19/2021   ? ?   Reactions  ? Aspirin Other (See Comments)  ? Stomach upset  ? ?  ? ?  ?Medication List  ?  ? ?  ? Accurate as of Jul 19, 2021  2:27 PM. If you have any questions, ask your nurse or doctor.  ?  ?  ? ?  ? ?STOP taking these medications   ? ?ALKA-SELTZER HEARTBURN PO ?Stopped by: Chesley Mires, MD ?  ?Anoro Ellipta 62.5-25 MCG/ACT Aepb ?Generic drug: umeclidinium-vilanterol ?Stopped by: Chesley Mires, MD ?  ?CENTRUM SILVER PO ?Stopped by: Chesley Mires, MD ?  ?cephALEXin 500 MG capsule ?Commonly known as: Keflex ?Stopped by: Chesley Mires, MD ?  ?tamsulosin 0.4 MG Caps  capsule ?Commonly known as: FLOMAX ?Stopped by: Chesley Mires, MD ?  ? ?  ? ?TAKE these medications   ? ?albuterol 108 (90 Base) MCG/ACT inhaler ?Commonly known as: VENTOLIN HFA ?SMARTSIG:1-2 Puff(s) By Mouth Every 6 Hours PRN ?  ?buPROPion ER 100 MG 12 hr tablet ?Commonly known as: WELLBUTRIN SR ?Take 100 mg by mouth 2 (two) times daily. ?  ?cetirizine 10 MG tablet ?Commonly known as: ZyrTEC Allergy ?Take 1 tablet (10 mg total) by mouth daily. ?Started by: Chesley Mires, MD ?  ?fluticasone 50 MCG/ACT nasal spray ?Commonly known as: FLONASE ?Place 1 spray into both nostrils daily. ?Started by: Chesley Mires, MD ?  ?Trelegy Ellipta 100-62.5-25 MCG/ACT Aepb ?Generic drug: Fluticasone-Umeclidin-Vilant ?INHALE 1 PUFF DAILY ?  ? ?  ? ? ?Signature:  ?Chesley Mires, MD ?[facility] ?Pager - [phone] ?07/19/2021, 2:27 PM ?  ? ? ? ? ? ? ? ? ?

## 2021-07-21 LAB — ANA W/REFLEX: ANA Titer 1: NEGATIVE

## 2021-07-30 LAB — RHEUMATOID FACTOR: Rhuematoid fact SerPl-aCnc: <14 IU/mL (ref <14)

## 2021-07-30 LAB — SJOGREN'S SYNDROME ANTIBODS(SSA + SSB)
SSA (Ro) (ENA) Antibody, IgG: <1 AI
SSB (La) (ENA) Antibody, IgG: <1 AI

## 2021-07-30 LAB — IGE: IgE (Immunoglobulin E), Serum: 129 kU/L — ABNORMAL HIGH (ref <=114)

## 2021-07-30 LAB — ANTI-SCLERODERMA ANTIBODY: Scleroderma (Scl-70) (ENA) Antibody, IgG: <1 AI

## 2021-07-30 LAB — ANCA SCREEN W REFLEX TITER: ANCA SCREEN: NEGATIVE

## 2021-08-02 DIAGNOSIS — R351 Nocturia: Secondary | ICD-10-CM | POA: Diagnosis not present

## 2021-08-02 DIAGNOSIS — C61 Malignant neoplasm of prostate: Secondary | ICD-10-CM | POA: Diagnosis not present

## 2021-08-02 DIAGNOSIS — N1330 Unspecified hydronephrosis: Secondary | ICD-10-CM | POA: Diagnosis not present

## 2021-08-21 DIAGNOSIS — Z131 Encounter for screening for diabetes mellitus: Secondary | ICD-10-CM | POA: Diagnosis not present

## 2021-08-21 DIAGNOSIS — N1330 Unspecified hydronephrosis: Secondary | ICD-10-CM | POA: Diagnosis not present

## 2021-08-21 DIAGNOSIS — F17210 Nicotine dependence, cigarettes, uncomplicated: Secondary | ICD-10-CM | POA: Diagnosis not present

## 2021-08-21 DIAGNOSIS — N200 Calculus of kidney: Secondary | ICD-10-CM | POA: Diagnosis not present

## 2021-08-21 DIAGNOSIS — C61 Malignant neoplasm of prostate: Secondary | ICD-10-CM | POA: Diagnosis not present

## 2021-08-21 DIAGNOSIS — Z0000 Encounter for general adult medical examination without abnormal findings: Secondary | ICD-10-CM | POA: Diagnosis not present

## 2021-08-21 DIAGNOSIS — Z6832 Body mass index (BMI) 32.0-32.9, adult: Secondary | ICD-10-CM | POA: Diagnosis not present

## 2021-08-21 DIAGNOSIS — Z1211 Encounter for screening for malignant neoplasm of colon: Secondary | ICD-10-CM | POA: Diagnosis not present

## 2021-08-21 DIAGNOSIS — K760 Fatty (change of) liver, not elsewhere classified: Secondary | ICD-10-CM | POA: Diagnosis not present

## 2021-08-21 DIAGNOSIS — Z125 Encounter for screening for malignant neoplasm of prostate: Secondary | ICD-10-CM | POA: Diagnosis not present

## 2021-08-21 DIAGNOSIS — N134 Hydroureter: Secondary | ICD-10-CM | POA: Diagnosis not present

## 2021-08-21 DIAGNOSIS — Z13220 Encounter for screening for lipoid disorders: Secondary | ICD-10-CM | POA: Diagnosis not present

## 2021-08-21 DIAGNOSIS — K5730 Diverticulosis of large intestine without perforation or abscess without bleeding: Secondary | ICD-10-CM | POA: Diagnosis not present

## 2021-08-21 DIAGNOSIS — N281 Cyst of kidney, acquired: Secondary | ICD-10-CM | POA: Diagnosis not present

## 2021-08-30 DIAGNOSIS — N1330 Unspecified hydronephrosis: Secondary | ICD-10-CM | POA: Diagnosis not present

## 2021-08-30 DIAGNOSIS — R351 Nocturia: Secondary | ICD-10-CM | POA: Diagnosis not present

## 2021-08-30 DIAGNOSIS — C61 Malignant neoplasm of prostate: Secondary | ICD-10-CM | POA: Diagnosis not present

## 2021-08-30 DIAGNOSIS — R82998 Other abnormal findings in urine: Secondary | ICD-10-CM | POA: Diagnosis not present

## 2021-08-31 ENCOUNTER — Ambulatory Visit (INDEPENDENT_AMBULATORY_CARE_PROVIDER_SITE_OTHER): Payer: BC Managed Care – PPO | Admitting: Pulmonary Disease

## 2021-08-31 DIAGNOSIS — J849 Interstitial pulmonary disease, unspecified: Secondary | ICD-10-CM | POA: Diagnosis not present

## 2021-08-31 DIAGNOSIS — J449 Chronic obstructive pulmonary disease, unspecified: Secondary | ICD-10-CM

## 2021-08-31 LAB — PULMONARY FUNCTION TEST
DL/VA % pred: 70 %
DL/VA: 2.95 ml/min/mmHg/L
DLCO cor % pred: 74 %
DLCO cor: 20.57 ml/min/mmHg
DLCO unc % pred: 74 %
DLCO unc: 20.57 ml/min/mmHg
FEF 25-75 Post: 1.66 L/sec
FEF 25-75 Pre: 1.27 L/sec
FEF2575-%Change-Post: 31 %
FEF2575-%Pred-Post: 56 %
FEF2575-%Pred-Pre: 43 %
FEV1-%Change-Post: 7 %
FEV1-%Pred-Post: 72 %
FEV1-%Pred-Pre: 67 %
FEV1-Post: 2.61 L
FEV1-Pre: 2.42 L
FEV1FVC-%Change-Post: 0 %
FEV1FVC-%Pred-Pre: 82 %
FEV6-%Change-Post: 7 %
FEV6-%Pred-Post: 92 %
FEV6-%Pred-Pre: 86 %
FEV6-Post: 4.18 L
FEV6-Pre: 3.89 L
FEV6FVC-%Change-Post: -1 %
FEV6FVC-%Pred-Post: 103 %
FEV6FVC-%Pred-Pre: 104 %
FVC-%Change-Post: 8 %
FVC-%Pred-Post: 89 %
FVC-%Pred-Pre: 82 %
FVC-Post: 4.22 L
FVC-Pre: 3.89 L
Post FEV1/FVC ratio: 62 %
Post FEV6/FVC ratio: 99 %
Pre FEV1/FVC ratio: 62 %
Pre FEV6/FVC Ratio: 100 %
RV % pred: 116 %
RV: 2.63 L
TLC % pred: 101 %
TLC: 7.08 L

## 2021-08-31 NOTE — Progress Notes (Signed)
Full PFT performed today. °

## 2021-09-06 DIAGNOSIS — J439 Emphysema, unspecified: Secondary | ICD-10-CM | POA: Diagnosis not present

## 2021-09-06 DIAGNOSIS — Z8546 Personal history of malignant neoplasm of prostate: Secondary | ICD-10-CM | POA: Diagnosis not present

## 2021-09-06 DIAGNOSIS — R93421 Abnormal radiologic findings on diagnostic imaging of right kidney: Secondary | ICD-10-CM | POA: Diagnosis not present

## 2021-09-06 DIAGNOSIS — E669 Obesity, unspecified: Secondary | ICD-10-CM | POA: Diagnosis not present

## 2021-09-06 DIAGNOSIS — N131 Hydronephrosis with ureteral stricture, not elsewhere classified: Secondary | ICD-10-CM | POA: Diagnosis not present

## 2021-09-06 DIAGNOSIS — Z923 Personal history of irradiation: Secondary | ICD-10-CM | POA: Diagnosis not present

## 2021-09-06 DIAGNOSIS — Z6831 Body mass index (BMI) 31.0-31.9, adult: Secondary | ICD-10-CM | POA: Diagnosis not present

## 2021-09-06 DIAGNOSIS — N1330 Unspecified hydronephrosis: Secondary | ICD-10-CM | POA: Diagnosis not present

## 2021-09-06 DIAGNOSIS — N135 Crossing vessel and stricture of ureter without hydronephrosis: Secondary | ICD-10-CM | POA: Diagnosis not present

## 2021-09-06 DIAGNOSIS — F17210 Nicotine dependence, cigarettes, uncomplicated: Secondary | ICD-10-CM | POA: Diagnosis not present

## 2021-09-12 ENCOUNTER — Ambulatory Visit: Payer: BC Managed Care – PPO | Admitting: Pulmonary Disease

## 2021-09-12 ENCOUNTER — Encounter: Payer: Self-pay | Admitting: Pulmonary Disease

## 2021-09-12 VITALS — BP 140/88 | HR 103

## 2021-09-12 DIAGNOSIS — R911 Solitary pulmonary nodule: Secondary | ICD-10-CM

## 2021-09-12 DIAGNOSIS — J449 Chronic obstructive pulmonary disease, unspecified: Secondary | ICD-10-CM | POA: Diagnosis not present

## 2021-09-12 DIAGNOSIS — J849 Interstitial pulmonary disease, unspecified: Secondary | ICD-10-CM

## 2021-09-12 DIAGNOSIS — J4489 Other specified chronic obstructive pulmonary disease: Secondary | ICD-10-CM

## 2021-09-12 NOTE — Patient Instructions (Signed)
Will schedule CT chest for April 2024 and follow up after this

## 2021-09-12 NOTE — Progress Notes (Signed)
Pulmonary, Critical Care, and Sleep Medicine  Chief Complaint  Patient presents with   Follow-up    He is doing well and does not have as many breathing problems.      Past Surgical History:  He  has a past surgical history that includes Prostate biopsy (08-13-2017     dr eskridge  office); Inguinal hernia repair (Right, 12-29-2000    dr t. price  [place]); Umbilical hernia repair (01-05-2007    dr Georgette Dover  [facility]); Tonsillectomy (age 89); and Thulium laser TURP (transurethral resection of prostate) (N/A, 10/14/2017).  Past Medical History:  BPH, Prostate cancer, GERD, PPD positive  Constitutional:  BP 140/88 (BP Location: Left Arm, Cuff Size: Normal)   Pulse (!) 103   SpO2 95%   Brief Summary:  Wyatt [name] is a 61 y.o. male former smoker with emphysema and interstitial lung disease.       Subjective:   He is no longer smoking tobacco cigarettes.  He vapes occasionally, but plans to stop this soon.  Trelegy helps.  Not having cough, wheeze, or chest congestion.  Sleeping better.  PFT from 08/31/21 showed mild obstruction and diffusion defect.  Serology was negative from May 2023.   Physical Exam:   Appearance - well kempt   ENMT - no sinus tenderness, no oral exudate, no LAN, Mallampati 3 airway, no stridor, wears dentures  Respiratory - equal breath sounds bilaterally, no wheezing or rales  CV - s1s2 regular rate and rhythm, no murmurs  Ext - no clubbing, no edema  Skin - no rashes  Psych - normal mood and affect    Pulmonary testing:  Serology 07/19/21 >> negative IgE 07/19/21 >> 129 PFT 08/31/21 >> FEV1 2.61 (72%), FEV1% 62, TLC 7.08 (101%), DLCO 74%  Chest Imaging:  CT chest 01/29/18 >> atherosclerosis, moderate centrilobular and paraseptal emphysema, fibrosis at lung bases, lower lobe BTX LDCT chest 07/01/21 >> Atherosclerosis, severe centrilobular emphysema, bronchial thickening; 3.8 mm nodule RLL, Lt granuloma; mod patchy subpleural reticulation and  GGO with basilar predominance, mild traction bronchiolectasis (possible UIP); fatty liver, Rt hydronephrosis  Social History:  He  reports that he quit smoking about 4 weeks ago. His smoking use included cigarettes. He has a 37.50 pack-year smoking history. He has never used smokeless tobacco. He reports current alcohol use.  Family History:  His family history includes Alzheimer's disease in his mother; Bladder Cancer in his cousin; Esophageal cancer in his maternal grandfather; Kidney Stones in his brother; Lung cancer in his mother.     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 - continue trelegy 100 one puff daily - prn albuterol  Interstitial lung disease with possible UIP pattern. - minimal changes on PFT  - will reassess status after his CT chest in April 2024  Upper airway cough syndrome. - prn flonase, zyrtec  3.8 mm Rt lower lobe nodule. - f/u CT chest without contrast for April 2024  Time Spent Involved in Patient Care on Day of Examination:  37 minutes  Follow up:   Patient Instructions  Will schedule CT chest for April 2024 and follow up after this  Medication List:   Allergies as of 09/12/2021   No Known Allergies      Medication List        Accurate as of September 12, 2021  4:53 PM. If you have any questions, ask your nurse or doctor.          albuterol 108 (90  Base) MCG/ACT inhaler Commonly known as: VENTOLIN HFA SMARTSIG:1-2 Puff(s) By Mouth Every 6 Hours PRN   buPROPion ER 100 MG 12 hr tablet Commonly known as: WELLBUTRIN SR Take 100 mg by mouth 2 (two) times daily.   cetirizine 10 MG tablet Commonly known as: ZyrTEC Allergy Take 1 tablet (10 mg total) by mouth daily.   fluticasone 50 MCG/ACT nasal spray Commonly known as: FLONASE Place 1 spray into both nostrils daily.   Trelegy Ellipta 100-62.5-25 MCG/ACT Aepb Generic drug: Fluticasone-Umeclidin-Vilant INHALE 1 PUFF DAILY         Signature:  Chesley Mires, MD [place] Pager - [phone] 09/12/2021, 4:53 PM

## 2021-09-27 DIAGNOSIS — F17210 Nicotine dependence, cigarettes, uncomplicated: Secondary | ICD-10-CM | POA: Diagnosis not present

## 2021-09-27 DIAGNOSIS — N1330 Unspecified hydronephrosis: Secondary | ICD-10-CM | POA: Diagnosis not present

## 2021-09-27 DIAGNOSIS — J439 Emphysema, unspecified: Secondary | ICD-10-CM | POA: Diagnosis not present

## 2021-09-27 DIAGNOSIS — C61 Malignant neoplasm of prostate: Secondary | ICD-10-CM | POA: Diagnosis not present

## 2021-10-10 DIAGNOSIS — Z466 Encounter for fitting and adjustment of urinary device: Secondary | ICD-10-CM | POA: Diagnosis not present

## 2021-10-10 DIAGNOSIS — N1330 Unspecified hydronephrosis: Secondary | ICD-10-CM | POA: Diagnosis not present

## 2021-10-10 DIAGNOSIS — C61 Malignant neoplasm of prostate: Secondary | ICD-10-CM | POA: Diagnosis not present

## 2021-10-10 DIAGNOSIS — R351 Nocturia: Secondary | ICD-10-CM | POA: Diagnosis not present

## 2021-10-24 DIAGNOSIS — N1330 Unspecified hydronephrosis: Secondary | ICD-10-CM | POA: Diagnosis not present

## 2021-10-24 DIAGNOSIS — R351 Nocturia: Secondary | ICD-10-CM | POA: Diagnosis not present

## 2021-10-24 DIAGNOSIS — C61 Malignant neoplasm of prostate: Secondary | ICD-10-CM | POA: Diagnosis not present

## 2021-10-29 ENCOUNTER — Other Ambulatory Visit: Payer: Self-pay | Admitting: Pulmonary Disease

## 2022-03-07 DIAGNOSIS — J019 Acute sinusitis, unspecified: Secondary | ICD-10-CM | POA: Diagnosis not present

## 2022-03-07 DIAGNOSIS — R059 Cough, unspecified: Secondary | ICD-10-CM | POA: Diagnosis not present

## 2022-03-07 DIAGNOSIS — R0981 Nasal congestion: Secondary | ICD-10-CM | POA: Diagnosis not present

## 2022-06-13 ENCOUNTER — Ambulatory Visit
Admission: RE | Admit: 2022-06-13 | Discharge: 2022-06-13 | Disposition: A | Discharge status: Home / Self Care | Payer: BC Managed Care – PPO | Source: Ambulatory Visit | Attending: Pulmonary Disease | Admitting: Pulmonary Disease

## 2022-06-13 DIAGNOSIS — J439 Emphysema, unspecified: Secondary | ICD-10-CM | POA: Diagnosis not present

## 2022-06-13 DIAGNOSIS — J849 Interstitial pulmonary disease, unspecified: Secondary | ICD-10-CM

## 2022-06-13 DIAGNOSIS — R911 Solitary pulmonary nodule: Secondary | ICD-10-CM

## 2022-06-13 DIAGNOSIS — I700 Atherosclerosis of aorta: Secondary | ICD-10-CM | POA: Diagnosis not present

## 2022-07-02 ENCOUNTER — Ambulatory Visit (INDEPENDENT_AMBULATORY_CARE_PROVIDER_SITE_OTHER): Payer: BC Managed Care – PPO | Admitting: Pulmonary Disease

## 2022-07-02 ENCOUNTER — Encounter (HOSPITAL_BASED_OUTPATIENT_CLINIC_OR_DEPARTMENT_OTHER): Payer: Self-pay | Admitting: Pulmonary Disease

## 2022-07-02 VITALS — BP 120/78 | HR 90 | Temp 97.7°F | Ht 70.0 in | Wt 213.2 lb

## 2022-07-02 DIAGNOSIS — J439 Emphysema, unspecified: Secondary | ICD-10-CM | POA: Diagnosis not present

## 2022-07-02 DIAGNOSIS — J849 Interstitial pulmonary disease, unspecified: Secondary | ICD-10-CM

## 2022-07-02 DIAGNOSIS — J4489 Other specified chronic obstructive pulmonary disease: Secondary | ICD-10-CM

## 2022-07-02 DIAGNOSIS — R911 Solitary pulmonary nodule: Secondary | ICD-10-CM | POA: Diagnosis not present

## 2022-07-02 MED ORDER — TRELEGY ELLIPTA 100-62.5-25 MCG/ACT IN AEPB: 1.0000 | INHALATION_SPRAY | Freq: Every day | RESPIRATORY_TRACT | 3 refills | Status: DC

## 2022-07-02 NOTE — Patient Instructions (Signed)
Will schedule follow up CT chest in September 2024 and then have appointment after this

## 2022-07-02 NOTE — Progress Notes (Signed)
[facility], [facility], [facility] Sleep [facility]  Chief Complaint  Patient presents with   Follow-up    Follow up. No complaints.     Past Surgical History:  He  has a past surgical history that includes Prostate biopsy (08-13-2017     dr eskridge  office); Inguinal hernia repair (Right, 12-29-2000    dr t. price  [facility]); Umbilical hernia repair (01-05-2007    dr Corliss Skains  [facility]); Tonsillectomy (age 62); and Thulium laser TURP (transurethral resection of prostate) (N/A, 10/14/2017).  Past Medical History:  BPH, Prostate cancer, GERD, PPD positive  Constitutional:  BP 120/78 (BP Location: Left Arm, Patient Position: Sitting, Cuff Size: Normal)   Pulse 90   Temp 97.7 F (36.5 C) (Oral)   Ht  (1.778 m)   Wt 213 lb 3.2 oz (96.7 kg)   SpO2 94%   BMI 30.59 kg/m   Brief Summary:  Wyatt [name] is a 62 y.o. male former smoker with emphysema and interstitial lung disease.       Subjective:   He is still vaping, but has decreased how much he puts in.  CT chest showed new LUL nodule.  Emphysema and ILD findings stable.  Not having cough, wheeze, or sputum.  Doesn't need albuterol much.  Physical Exam:   Appearance - well kempt   ENMT - no sinus tenderness, no oral exudate, no LAN, Mallampati 3 airway, no stridor, wears dentures  Respiratory - equal breath sounds bilaterally, no wheezing or rales  CV - s1s2 regular rate and rhythm, no murmurs  Ext - no clubbing, no edema  Skin - no rashes  Psych - normal mood and affect     Pulmonary testing:  Serology 07/19/21 >> negative IgE 07/19/21 >> 129 PFT 08/31/21 >> FEV1 2.61 (72%), FEV1% 62, TLC 7.08 (101%), DLCO 74%  Chest Imaging:  CT chest 01/29/18 >> atherosclerosis, moderate centrilobular and paraseptal emphysema, fibrosis at lung bases, lower lobe BTX LDCT chest 07/01/21 >> Atherosclerosis, severe centrilobular emphysema, bronchial thickening; 3.8 mm nodule RLL, Lt granuloma; mod patchy subpleural reticulation  and GGO with basilar predominance, mild traction bronchiolectasis (possible UIP); fatty liver, Rt hydronephrosis CT chest 06/14/22 >> severe emphysema, basilar subpleural reticulation and GGO with traction BTX, new 4 mm nodule LUL, calcified granuloma  Social History:  He  reports that he quit smoking about 10 months ago. His smoking use included cigarettes. He has a 37.50 pack-year smoking history. He has never used smokeless tobacco. He reports current alcohol use.  Family History:  His family history includes Alzheimer's disease in his mother; Bladder Cancer in his cousin; Esophageal cancer in his maternal grandfather; Kidney Stones in his brother; Lung cancer in his mother.     Assessment/Plan:   Lung nodule. 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 - continue trelegy 100 one puff daily - prn albuterol  Interstitial lung disease with possible UIP pattern. - no change on most recent CT imaging - monitor clinically  Upper airway cough syndrome. - prn flonase, zyrtec  Time Spent Involved in Patient Care on Day of Examination:  36 minutes  Follow up:   Patient Instructions  Will schedule follow up CT chest in September 2024 and then have appointment after this  Medication List:   Allergies as of 07/02/2022   No Known Allergies      Medication List        Accurate as  of July 02, 2022 10:28 AM. If you have any questions, ask your nurse or doctor.          albuterol 108 (90 Base) MCG/ACT inhaler Commonly known as: VENTOLIN HFA SMARTSIG:1-2 Puff(s) By Mouth Every 6 Hours PRN   buPROPion ER 100 MG 12 hr tablet Commonly known as: WELLBUTRIN SR Take 100 mg by mouth 2 (two) times daily.   cetirizine 10 MG tablet Commonly known as: ZyrTEC Allergy Take 1 tablet (10 mg total) by mouth daily.   fluticasone 50 MCG/ACT nasal spray Commonly known as: FLONASE SPRAY  1 SPRAY INTO BOTH NOSTRILS DAILY.   Trelegy Ellipta 100-62.5-25 MCG/ACT Aepb Generic drug: Fluticasone-Umeclidin-Vilant Inhale 1 puff into the lungs daily in the afternoon. What changed: See the new instructions. Changed by: Coralyn Helling, MD        Signature:  Coralyn Helling, MD [facility]/Critical [facility] Pager - [phone] 07/02/2022, 10:28 AM

## 2022-11-15 ENCOUNTER — Ambulatory Visit
Admission: RE | Admit: 2022-11-15 | Discharge: 2022-11-15 | Disposition: A | Discharge status: Home / Self Care | Payer: BC Managed Care – PPO | Source: Ambulatory Visit | Attending: Pulmonary Disease | Admitting: Pulmonary Disease

## 2022-11-15 DIAGNOSIS — I2510 Atherosclerotic heart disease of native coronary artery without angina pectoris: Secondary | ICD-10-CM | POA: Diagnosis not present

## 2022-11-15 DIAGNOSIS — I700 Atherosclerosis of aorta: Secondary | ICD-10-CM | POA: Diagnosis not present

## 2022-11-15 DIAGNOSIS — R911 Solitary pulmonary nodule: Secondary | ICD-10-CM

## 2023-05-14 ENCOUNTER — Telehealth: Payer: Self-pay | Admitting: Pulmonary Disease

## 2023-05-14 MED ORDER — TRELEGY ELLIPTA 100-62.5-25 MCG/ACT IN AEPB: 1.0000 | INHALATION_SPRAY | Freq: Every day | RESPIRATORY_TRACT | 0 refills | Status: DC

## 2023-05-14 NOTE — Telephone Encounter (Signed)
 [facility]   PT needs Trelegy refill.His # is [phone]. (Making appt for him today for FU)

## 2023-06-10 ENCOUNTER — Other Ambulatory Visit: Payer: Self-pay | Admitting: Pulmonary Disease

## 2023-07-09 ENCOUNTER — Encounter: Payer: Self-pay | Admitting: Pulmonary Disease

## 2023-07-09 ENCOUNTER — Ambulatory Visit (INDEPENDENT_AMBULATORY_CARE_PROVIDER_SITE_OTHER): Admitting: Pulmonary Disease

## 2023-07-09 VITALS — BP 148/80 | HR 86 | Ht 70.0 in | Wt 230.0 lb

## 2023-07-09 DIAGNOSIS — J4489 Other specified chronic obstructive pulmonary disease: Secondary | ICD-10-CM

## 2023-07-09 DIAGNOSIS — J439 Emphysema, unspecified: Secondary | ICD-10-CM | POA: Diagnosis not present

## 2023-07-09 DIAGNOSIS — R911 Solitary pulmonary nodule: Secondary | ICD-10-CM

## 2023-07-09 DIAGNOSIS — Z87891 Personal history of nicotine dependence: Secondary | ICD-10-CM

## 2023-07-09 DIAGNOSIS — J310 Chronic rhinitis: Secondary | ICD-10-CM

## 2023-07-09 MED ORDER — TRELEGY ELLIPTA 100-62.5-25 MCG/ACT IN AEPB: 1.0000 | INHALATION_SPRAY | Freq: Every day | RESPIRATORY_TRACT | 6 refills | Status: AC

## 2023-07-09 MED ORDER — ALBUTEROL SULFATE HFA 108 (90 BASE) MCG/ACT IN AERS: 1.0000 | INHALATION_SPRAY | Freq: Four times a day (QID) | RESPIRATORY_TRACT | 11 refills | Status: AC | PRN

## 2023-07-09 MED ORDER — IPRATROPIUM BROMIDE 0.03 % NA SOLN: 2.0000 | Freq: Two times a day (BID) | NASAL | 12 refills | Status: AC

## 2023-07-09 NOTE — Patient Instructions (Addendum)
 Continue trelegy ellipta  1 puff daily - rinse mouth out after each use  Use albuterol inhaler 1-2 puffs every 4-6 hours as needed  Start ipratropium nasal spray, 2 sprays per nostril twice daily as needed  Recommend using nicotine replacement therapy to help quit vaping - use 7-14mg  nicotine patches per day - use 2mg  mini nicotine lozenges  We will refer you to our lung cancer screening team for ongoing CT Chest scans  Follow up in 1 year, call sooner if needed

## 2023-07-09 NOTE — Progress Notes (Signed)
 Synopsis: hx of COPD and lung nodules  Subjective:   PATIENT ID: Wyatt [name] GENDER: male DOB: Mar 07, 1961, MRN: [id number]   HPI  Chief Complaint  Patient presents with   Follow-up    Remer [name] "Wyatt [name]" is a 63 year old male with COPD and emphysema who presents for a follow-up visit.  He uses Trelegy daily for COPD and emphysema, which is crucial for symptom control. He is considering resuming mountain biking and plans to carry albuterol as a precaution, though he does not use it regularly.  He experiences chronic rhinorrhea. Flonase  provides minimal relief, and he is interested in trying ipratropium nasal spray.  He switched from smoking to vaping nicotine at 3% concentration and is contemplating reducing nicotine intake by switching to zero nicotine vape.  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  His last CT scan showed stable emphysema and mild fibrosis with scarring at the lung bases. A lung nodule previously decreased in size. He is due for another CT scan this fall.  He monitors his oxygen levels at home, which usually remain at 95% or higher, with occasional dips.  Past Medical History:  Diagnosis Date   Benign localized prostatic hyperplasia with lower urinary tract symptoms (LUTS)    COPD (chronic obstructive pulmonary disease) (HCC)    GERD (gastroesophageal reflux disease)    Prostate cancer [facility]) urologist-  dr eskridge/  oncologist-  dr Lorri Rota   dx 08-13-2017-- Stage T2b,  Gleason 4+5, PSA 3.89, vol 45 grams-- treatment plan LT-ADT and IMRT   Weak urinary stream    Wears glasses    Wears partial dentures    upper     Family History  Problem Relation Age of Onset   Lung cancer Mother        smoker   Alzheimer's disease Mother    Kidney Stones Brother    Esophageal cancer Maternal Grandfather        smoker   Bladder Cancer Cousin      Social History    Socioeconomic History   Marital status: Married    Spouse name: Not on file   Number of children: 4   Years of education: Not on file   Highest education level: Not on file  Occupational History   Occupation: Maintenance Technician  Tobacco Use   Smoking status: Former    Current packs/day: 0.00    Average packs/day: 1.5 packs/day for 25.0 years (37.5 ttl pk-yrs)    Types: Cigarettes    Start date: 08/10/1996    Quit date: 08/10/2021    Years since quitting: 1.9   Smokeless tobacco: Never  Vaping Use   Vaping status: Never Used  Substance and Sexual Activity   Alcohol use: Yes    Comment: very seldom   Drug use: Not on file   Sexual activity: Not on file  Other Topics Concern   Not on file  Social History Narrative   Married with four sons.    Social Drivers of Corporate investment banker Strain: Not on file  Food Insecurity: Not on file  Transportation Needs: No Transportation Needs (03/12/2018)   PRAPARE - Administrator, Civil Service (Medical): No    Lack of Transportation (Non-Medical): No  Physical Activity: Not on file  Stress: Not on file  Social Connections: Unknown (07/23/2021)   Received [facility], [facility]   Social Network  Social Network: Not on file  Intimate Partner Violence: Unknown (06/14/2021)   Received from [facility], [facility]   HITS    Physically Hurt: Not on file    Insult or Talk Down To: Not on file    Threaten Physical Harm: Not on file    Scream or Curse: Not on file     No Known Allergies   Outpatient Medications Prior to Visit  Medication Sig Dispense Refill   cetirizine  (ZYRTEC  ALLERGY) 10 MG tablet Take 1 tablet (10 mg total) by mouth daily.     albuterol (VENTOLIN HFA) 108 (90 Base) MCG/ACT inhaler SMARTSIG:1-2 Puff(s) By Mouth Every 6 Hours PRN     TRELEGY ELLIPTA  100-62.5-25 MCG/ACT AEPB INHALE 1 PUFF INTO THE LUNGS DAILY IN THE AFTERNOON. 60 each 0   buPROPion ER (WELLBUTRIN SR) 100 MG 12 hr  tablet Take 100 mg by mouth 2 (two) times daily. (Patient not taking: Reported on 07/09/2023)     fluticasone  (FLONASE ) 50 MCG/ACT nasal spray SPRAY 1 SPRAY INTO BOTH NOSTRILS DAILY. (Patient not taking: Reported on 07/09/2023) 16 mL 2   No facility-administered medications prior to visit.    Review of Systems  Constitutional:  Negative for chills, fever, malaise/fatigue and weight loss.  HENT:  Negative for congestion, sinus pain and sore throat.   Eyes: Negative.   Respiratory:  Positive for shortness of breath. Negative for cough, hemoptysis, sputum production and wheezing.   Cardiovascular:  Negative for chest pain, palpitations, orthopnea, claudication and leg swelling.  Gastrointestinal:  Negative for abdominal pain, heartburn, nausea and vomiting.  Genitourinary: Negative.   Musculoskeletal:  Negative for joint pain and myalgias.  Skin:  Negative for rash.  Neurological:  Negative for weakness.  Endo/Heme/Allergies: Negative.   Psychiatric/Behavioral: Negative.     Objective:   Vitals:   07/09/23 1440  BP: (!) 148/80  Pulse: 86  SpO2: 96%  Weight: 230 lb (104.3 kg)  Height: 5\' 10"  (1.778 m)   Physical Exam Constitutional:      General: He is not in acute distress.    Appearance: Normal appearance.  Eyes:     General: No scleral icterus.    Conjunctiva/sclera: Conjunctivae normal.  Cardiovascular:     Rate and Rhythm: Normal rate and regular rhythm.  Pulmonary:     Breath sounds: No wheezing, rhonchi or rales.  Musculoskeletal:     Right lower leg: No edema.     Left lower leg: No edema.  Skin:    General: Skin is warm and dry.  Neurological:     General: No focal deficit present.    CBC    Component Value Date/Time   WBC 7.5 07/19/2021 1431   RBC 5.33 07/19/2021 1431   HGB 16.6 07/19/2021 1431   HCT 48.3 07/19/2021 1431   PLT 197.0 07/19/2021 1431   MCV 90.7 07/19/2021 1431   MCHC 34.4 07/19/2021 1431   RDW 13.7 07/19/2021 1431   LYMPHSABS 1.3  07/19/2021 1431   MONOABS 0.8 07/19/2021 1431   EOSABS 0.2 07/19/2021 1431   BASOSABS 0.1 07/19/2021 1431      Latest Ref Rng & Units 07/19/2021    2:31 PM 01/05/2007   11:35 AM  BMP  Glucose 70 - 99 mg/dL 696  99   BUN 6 - 23 mg/dL 15  9   Creatinine 2.95 - 1.50 mg/dL 2.84  1.32   Sodium 440 - 145 mEq/L 139  142   Potassium 3.5 - 5.1 mEq/L 4.0  3.7   Chloride 96 - 112 mEq/L 104  106   CO2 19 - 32 mEq/L 28  31   Calcium 8.4 - 10.5 mg/dL 9.5  9.1    Chest imaging: CT Chest 11/15/22 1. Interval decrease in size of left upper lobe pulmonary nodule. This is compatible with a benign postinflammatory process. 2. New tiny solid nodule within the posterior right middle lobe measures 2 mm. If the patient is at high risk for bronchogenic carcinoma, follow-up chest CT at 1 year is recommended. If the patient is at low risk, no follow-up is needed. This recommendation follows the consensus statement: Guidelines for Management of Small Pulmonary Nodules Detected on CT Scans: A Statement from the Fleischner Society as published in Radiology 2005; 237:395-400. 3. Bilateral peripheral and lower lung zone predominant subpleural reticulation and ground-glass attenuation. Findings are nonspecific but may represent early interstitial lung disease. 4. Coronary artery calcifications. 5. Aortic Atherosclerosis (ICD10-I70.0) and Emphysema (ICD10-J43.9).  PFT:    Latest Ref Rng & Units 08/31/2021    3:35 PM  PFT Results  FVC-Pre L 3.89   FVC-Predicted Pre % 82   FVC-Post L 4.22   FVC-Predicted Post % 89   Pre FEV1/FVC % % 62   Post FEV1/FCV % % 62   FEV1-Pre L 2.42   FEV1-Predicted Pre % 67   FEV1-Post L 2.61   DLCO uncorrected ml/min/mmHg 20.57   DLCO UNC% % 74   DLCO corrected ml/min/mmHg 20.57   DLCO COR %Predicted % 74   DLVA Predicted % 70   TLC L 7.08   TLC % Predicted % 101   RV % Predicted % 116     Labs:  Path:  Echo:  Heart Catheterization:       Assessment & Plan:    COPD with chronic bronchitis and emphysema (HCC) - Plan: TRELEGY ELLIPTA  100-62.5-25 MCG/ACT AEPB, albuterol (VENTOLIN HFA) 108 (90 Base) MCG/ACT inhaler  Rhinitis, unspecified type - Plan: ipratropium (ATROVENT) 0.03 % nasal spray  Former smoker - Plan: Ambulatory Referral for Lung Cancer Scre  Discussion: Wyatt [name] "Wyatt [name]" is a 63 year old male with COPD and emphysema who presents for a follow-up visit.  Emphysema with COPD - Continue Trelegy inhaler daily. - Prescribe albuterol inhaler for use as needed, especially before or during mountain biking. - Encourage regular physical activity, 30 minutes at least 3 days a week.  Lung nodule Benign lung nodule stable on recent CT. New 2mm nodule. Family Hx of lung cancer. - Refer to lung cancer screening team for follow-up scan this September  Nicotine dependence, vaping Chronic nicotine dependence with current vaping. Discussed nicotine reduction strategies and potential vaping-related inflammation. - Discuss nicotine replacement therapy options, including patches and lozenges. - Encourage reduction of vaping  Nasal congestion Chronic nasal congestion likely related to vaping and environmental exposures. Discussed ipratropium nasal spray for symptomatic relief. - Prescribe ipratropium nasal spray, 2 sprays per nostril twice daily as needed.  Follow up in 1 year.  Duaine German, MD [facility] Office: [phone]   Current Outpatient Medications:    cetirizine  (ZYRTEC  ALLERGY) 10 MG tablet, Take 1 tablet (10 mg total) by mouth daily., Disp: , Rfl:    ipratropium (ATROVENT) 0.03 % nasal spray, Place 2 sprays into both nostrils every 12 (twelve) hours., Disp: 30 mL, Rfl: 12   albuterol (VENTOLIN HFA) 108 (90 Base) MCG/ACT inhaler, Inhale 1-2 puffs into the lungs every 6 (six) hours as needed for wheezing or shortness of  breath., Disp: 8 g, Rfl: 11   TRELEGY ELLIPTA  100-62.5-25 MCG/ACT AEPB, Inhale 1  puff into the lungs daily in the afternoon., Disp: 60 each, Rfl: 6
